# Patient Record
Sex: Female | Born: 1955 | Race: White | Hispanic: No | Marital: Single | Smoking: Never smoker
Health system: Southern US, Community
[De-identification: ages and names within clinical notes are randomized; demographics above are authoritative.]

## PROBLEM LIST (undated history)

## (undated) DIAGNOSIS — J302 Other seasonal allergic rhinitis: Secondary | ICD-10-CM

## (undated) DIAGNOSIS — I341 Nonrheumatic mitral (valve) prolapse: Secondary | ICD-10-CM

## (undated) DIAGNOSIS — R3129 Other microscopic hematuria: Secondary | ICD-10-CM

## (undated) HISTORY — DX: Other microscopic hematuria: R31.29

## (undated) HISTORY — DX: Nonrheumatic mitral (valve) prolapse: I34.1

## (undated) HISTORY — PX: OTHER SURGICAL HISTORY: SHX169

## (undated) HISTORY — PX: WISDOM TOOTH EXTRACTION: SHX21

---

## 1996-10-18 HISTORY — PX: RHINOPLASTY: SUR1284

## 1998-04-07 ENCOUNTER — Ambulatory Visit (HOSPITAL_COMMUNITY): Admission: RE | Admit: 1998-04-07 | Discharge: 1998-04-07 | Payer: Self-pay | Admitting: Obstetrics and Gynecology

## 1998-11-19 ENCOUNTER — Ambulatory Visit (HOSPITAL_COMMUNITY): Admission: RE | Admit: 1998-11-19 | Discharge: 1998-11-19 | Payer: Self-pay | Admitting: Obstetrics and Gynecology

## 1998-11-19 ENCOUNTER — Encounter: Payer: Self-pay | Admitting: Obstetrics and Gynecology

## 1999-02-25 ENCOUNTER — Other Ambulatory Visit: Admission: RE | Admit: 1999-02-25 | Discharge: 1999-02-25 | Payer: Self-pay | Admitting: Obstetrics and Gynecology

## 1999-12-03 ENCOUNTER — Encounter: Payer: Self-pay | Admitting: Obstetrics and Gynecology

## 1999-12-03 ENCOUNTER — Ambulatory Visit (HOSPITAL_COMMUNITY): Admission: RE | Admit: 1999-12-03 | Discharge: 1999-12-03 | Payer: Self-pay | Admitting: Obstetrics and Gynecology

## 2000-02-26 ENCOUNTER — Other Ambulatory Visit: Admission: RE | Admit: 2000-02-26 | Discharge: 2000-02-26 | Payer: Self-pay | Admitting: *Deleted

## 2000-12-05 ENCOUNTER — Encounter: Payer: Self-pay | Admitting: Obstetrics and Gynecology

## 2000-12-05 ENCOUNTER — Ambulatory Visit (HOSPITAL_COMMUNITY): Admission: RE | Admit: 2000-12-05 | Discharge: 2000-12-05 | Payer: Self-pay | Admitting: Obstetrics and Gynecology

## 2001-03-29 ENCOUNTER — Other Ambulatory Visit: Admission: RE | Admit: 2001-03-29 | Discharge: 2001-03-29 | Payer: Self-pay | Admitting: Obstetrics and Gynecology

## 2002-01-11 ENCOUNTER — Ambulatory Visit (HOSPITAL_COMMUNITY): Admission: RE | Admit: 2002-01-11 | Discharge: 2002-01-11 | Payer: Self-pay | Admitting: Obstetrics and Gynecology

## 2002-01-11 ENCOUNTER — Encounter: Payer: Self-pay | Admitting: Obstetrics and Gynecology

## 2002-05-07 ENCOUNTER — Other Ambulatory Visit: Admission: RE | Admit: 2002-05-07 | Discharge: 2002-05-07 | Payer: Self-pay | Admitting: Obstetrics and Gynecology

## 2002-06-08 ENCOUNTER — Encounter: Admission: RE | Admit: 2002-06-08 | Discharge: 2002-06-08 | Payer: Self-pay | Admitting: Sports Medicine

## 2002-07-13 ENCOUNTER — Encounter: Admission: RE | Admit: 2002-07-13 | Discharge: 2002-07-13 | Payer: Self-pay | Admitting: Sports Medicine

## 2002-09-04 ENCOUNTER — Encounter: Admission: RE | Admit: 2002-09-04 | Discharge: 2002-09-04 | Payer: Self-pay | Admitting: Sports Medicine

## 2003-01-15 ENCOUNTER — Encounter: Payer: Self-pay | Admitting: Obstetrics and Gynecology

## 2003-01-15 ENCOUNTER — Ambulatory Visit (HOSPITAL_COMMUNITY): Admission: RE | Admit: 2003-01-15 | Discharge: 2003-01-15 | Payer: Self-pay | Admitting: Obstetrics and Gynecology

## 2003-04-12 ENCOUNTER — Encounter: Admission: RE | Admit: 2003-04-12 | Discharge: 2003-04-12 | Payer: Self-pay | Admitting: Sports Medicine

## 2003-06-28 ENCOUNTER — Other Ambulatory Visit: Admission: RE | Admit: 2003-06-28 | Discharge: 2003-06-28 | Payer: Self-pay | Admitting: Obstetrics and Gynecology

## 2004-02-04 ENCOUNTER — Ambulatory Visit (HOSPITAL_COMMUNITY): Admission: RE | Admit: 2004-02-04 | Discharge: 2004-02-04 | Payer: Self-pay | Admitting: Obstetrics and Gynecology

## 2004-07-03 ENCOUNTER — Other Ambulatory Visit: Admission: RE | Admit: 2004-07-03 | Discharge: 2004-07-03 | Payer: Self-pay | Admitting: Obstetrics and Gynecology

## 2005-02-26 ENCOUNTER — Ambulatory Visit (HOSPITAL_COMMUNITY): Admission: RE | Admit: 2005-02-26 | Discharge: 2005-02-26 | Payer: Self-pay | Admitting: Obstetrics and Gynecology

## 2005-03-11 ENCOUNTER — Encounter: Admission: RE | Admit: 2005-03-11 | Discharge: 2005-03-11 | Payer: Self-pay | Admitting: Obstetrics and Gynecology

## 2005-08-04 ENCOUNTER — Other Ambulatory Visit: Admission: RE | Admit: 2005-08-04 | Discharge: 2005-08-04 | Payer: Self-pay | Admitting: Obstetrics and Gynecology

## 2005-08-14 ENCOUNTER — Emergency Department (HOSPITAL_COMMUNITY): Admission: EM | Admit: 2005-08-14 | Discharge: 2005-08-14 | Payer: Self-pay | Admitting: Emergency Medicine

## 2005-08-19 ENCOUNTER — Encounter: Admission: RE | Admit: 2005-08-19 | Discharge: 2005-08-19 | Payer: Self-pay | Admitting: Specialist

## 2005-09-22 ENCOUNTER — Ambulatory Visit (HOSPITAL_COMMUNITY): Admission: RE | Admit: 2005-09-22 | Discharge: 2005-09-22 | Payer: Self-pay | Admitting: Gastroenterology

## 2006-03-25 ENCOUNTER — Ambulatory Visit (HOSPITAL_COMMUNITY): Admission: RE | Admit: 2006-03-25 | Discharge: 2006-03-25 | Payer: Self-pay | Admitting: Obstetrics and Gynecology

## 2006-09-21 ENCOUNTER — Other Ambulatory Visit: Admission: RE | Admit: 2006-09-21 | Discharge: 2006-09-21 | Payer: Self-pay | Admitting: Obstetrics and Gynecology

## 2006-10-18 LAB — HM COLONOSCOPY

## 2007-04-04 ENCOUNTER — Ambulatory Visit (HOSPITAL_COMMUNITY): Admission: RE | Admit: 2007-04-04 | Discharge: 2007-04-04 | Payer: Self-pay | Admitting: Obstetrics and Gynecology

## 2007-11-13 ENCOUNTER — Other Ambulatory Visit: Admission: RE | Admit: 2007-11-13 | Discharge: 2007-11-13 | Payer: Self-pay | Admitting: Obstetrics and Gynecology

## 2008-04-09 ENCOUNTER — Ambulatory Visit (HOSPITAL_COMMUNITY): Admission: RE | Admit: 2008-04-09 | Discharge: 2008-04-09 | Payer: Self-pay | Admitting: Obstetrics and Gynecology

## 2008-04-18 ENCOUNTER — Encounter: Admission: RE | Admit: 2008-04-18 | Discharge: 2008-04-18 | Payer: Self-pay | Admitting: Obstetrics and Gynecology

## 2008-10-23 ENCOUNTER — Encounter: Admission: RE | Admit: 2008-10-23 | Discharge: 2008-10-23 | Payer: Self-pay | Admitting: Obstetrics and Gynecology

## 2008-12-05 ENCOUNTER — Other Ambulatory Visit: Admission: RE | Admit: 2008-12-05 | Discharge: 2008-12-05 | Payer: Self-pay | Admitting: Obstetrics and Gynecology

## 2009-05-12 ENCOUNTER — Encounter: Admission: RE | Admit: 2009-05-12 | Discharge: 2009-05-12 | Payer: Self-pay | Admitting: Obstetrics and Gynecology

## 2009-08-13 ENCOUNTER — Ambulatory Visit (HOSPITAL_BASED_OUTPATIENT_CLINIC_OR_DEPARTMENT_OTHER): Admission: RE | Admit: 2009-08-13 | Discharge: 2009-08-13 | Payer: Self-pay | Admitting: Orthopedic Surgery

## 2010-05-21 ENCOUNTER — Encounter: Admission: RE | Admit: 2010-05-21 | Discharge: 2010-05-21 | Payer: Self-pay | Admitting: Obstetrics and Gynecology

## 2011-03-05 NOTE — Op Note (Signed)
Allison Lang, Allison Lang             ACCOUNT NO.:  [id number]   MEDICAL RECORD NO.:  [id number]          PATIENT TYPE:  AMB   LOCATION:  ENDO                         FACILITY:  [facility]   PHYSICIAN:  Anselmo Rod, M.D.  DATE OF BIRTH:  10/12/1956   DATE OF PROCEDURE:  09/22/2005  DATE OF DISCHARGE:                                 OPERATIVE REPORT   PROCEDURE:  Screening colonoscopy.   ENDOSCOPIST:  Charna Elizabeth, M.D.   INSTRUMENT USED:  Olympus video colonoscope (adjustable pediatric scope).   INDICATIONS FOR PROCEDURE:  55 year old white female undergoing screening  colonoscopy to rule out colonic polyps, masses, etc.   PREPROCEDURE PREPARATION:  Informed consent was obtained from the patient.  The patient was fasted for four hours prior to the procedure and prepped  with Osmo prep the night of and the morning of the procedure.  The patient  received a gram of Ancef for prophylaxis prior to the procedure.  The risks  and benefits of the procedure including a 10% miss rate of cancer and polyps  were discussed with the patient, as well.   PREPROCEDURE PHYSICAL:  Patient with stable vital signs.  Neck supple.  Chest clear to auscultation.  S1 and S2 regular.  Abdomen soft with normal  bowel sounds.   DESCRIPTION OF PROCEDURE:  The patient was placed in the left lateral  decubitus position, sedated with 100 mcg Fentanyl and 10 mg Versed in slow  incremental doses.  Once the patient was adequately sedated, maintained on  low flow oxygen and continuous cardiac monitoring, the Olympus video  colonoscope was advanced from the rectum to the cecum.  The appendiceal  orifice and ileocecal valve were clearly visualized and photographed.  The  terminal ileum appeared healthy without lesions.  No  masses, polyps,  erosions, ulcerations, or diverticula were seen.  The patient tolerated the  procedure well without complications.   IMPRESSION:  1.Normal colonoscopy to the terminal ileum, no  masses or  polyps.  2.No abnormalities on noted on retroflexion.   RECOMMENDATIONS:  1.Continue high fiber diet with liberal fluid intake.  2.Repeat colonoscopy is in the next ten years unless the patient develops  any abnormal symptoms in the interim.      Anselmo Rod, M.D.  Electronically Signed     JNM/MEDQ  D:  09/22/2005  T:  09/22/2005  Job:  [id number]   cc:   Edwena Felty. Romine, M.D.  Fax: [phone]   Candyce Churn, M.D.  Fax: [phone]

## 2011-04-23 ENCOUNTER — Other Ambulatory Visit: Payer: Self-pay | Admitting: Obstetrics and Gynecology

## 2011-04-23 DIAGNOSIS — Z1231 Encounter for screening mammogram for malignant neoplasm of breast: Secondary | ICD-10-CM

## 2011-05-25 ENCOUNTER — Ambulatory Visit
Admission: RE | Admit: 2011-05-25 | Discharge: 2011-05-25 | Disposition: A | Discharge status: Home / Self Care | Payer: PRIVATE HEALTH INSURANCE | Source: Ambulatory Visit | Attending: Obstetrics and Gynecology | Admitting: Obstetrics and Gynecology

## 2011-05-25 DIAGNOSIS — Z1231 Encounter for screening mammogram for malignant neoplasm of breast: Secondary | ICD-10-CM

## 2012-03-08 ENCOUNTER — Other Ambulatory Visit: Payer: Self-pay | Admitting: Obstetrics and Gynecology

## 2012-03-08 DIAGNOSIS — Z1231 Encounter for screening mammogram for malignant neoplasm of breast: Secondary | ICD-10-CM

## 2012-03-17 ENCOUNTER — Other Ambulatory Visit: Payer: Self-pay | Admitting: Obstetrics and Gynecology

## 2012-03-17 DIAGNOSIS — N644 Mastodynia: Secondary | ICD-10-CM

## 2012-04-05 ENCOUNTER — Ambulatory Visit
Admission: RE | Admit: 2012-04-05 | Discharge: 2012-04-05 | Disposition: A | Discharge status: Home / Self Care | Payer: PRIVATE HEALTH INSURANCE | Source: Ambulatory Visit | Attending: Obstetrics and Gynecology | Admitting: Obstetrics and Gynecology

## 2012-04-05 DIAGNOSIS — N644 Mastodynia: Secondary | ICD-10-CM

## 2012-05-26 ENCOUNTER — Ambulatory Visit
Admission: RE | Admit: 2012-05-26 | Discharge: 2012-05-26 | Disposition: A | Discharge status: Home / Self Care | Payer: PRIVATE HEALTH INSURANCE | Source: Ambulatory Visit | Attending: Obstetrics and Gynecology | Admitting: Obstetrics and Gynecology

## 2012-05-26 DIAGNOSIS — Z1231 Encounter for screening mammogram for malignant neoplasm of breast: Secondary | ICD-10-CM

## 2013-01-24 ENCOUNTER — Telehealth: Payer: Self-pay | Admitting: Obstetrics and Gynecology

## 2013-01-24 NOTE — Telephone Encounter (Signed)
No, I don't think she needs another course of progesterone now.  The bloating and puffiness could be from so many different things, I think I would just wait it out and see if it resolves on its own over the next week or so.  It she's having so much bleeding with her period, it could be that we need to increase the number of times she takes the progesterone.  How is she feeling on it?  Could she tolerate taking it every other month or even every month instead of just every 3 months?

## 2013-01-24 NOTE — Telephone Encounter (Signed)
bloating post period/pt concerned/please advise/Allison Lang

## 2013-01-24 NOTE — Telephone Encounter (Signed)
Spoke with pt who had a bad period 2 weeks ago after taking progesterone. Since Saturday, pt has felt bloated and puffy, "like something is clogged up". Pt is no longer bleeding. Pt says this is new for her. Pt wondering if she needs to take more progesterone or come in for OV. Please advise.  aa

## 2013-01-24 NOTE — Telephone Encounter (Signed)
Spoke with pt about not needing progesterone again right away. Asked pt how she feels on med, and she said "Great." Pt willing to use progesterone more often. Would she still take it for 10 days if she uses it every other month or every month? Pt will give this a try and let us know if things are better with bloating.  aa

## 2013-01-24 NOTE — Telephone Encounter (Signed)
Yes, I would rec 100 mg prometrium for 10 days every month.  Can send new rx because she will need more than what she has now. OK to refill through [facility].  Thanks.

## 2013-01-25 ENCOUNTER — Other Ambulatory Visit: Payer: Self-pay | Admitting: Orthopedic Surgery

## 2013-01-25 DIAGNOSIS — N951 Menopausal and female climacteric states: Secondary | ICD-10-CM

## 2013-01-25 MED ORDER — PROGESTERONE MICRONIZED 100 MG PO CAPS: 100.0000 mg | ORAL_CAPSULE | ORAL | Status: DC

## 2013-01-25 NOTE — Telephone Encounter (Signed)
Spoke with pt about taking prometrium every month or every other month. Pt requests refill to be called to [facility].  aa    Call to [facility] to cancel refill as pt does not use this pharmacy anymore.  aa    Call to [facility] for prometrium 100 mg caps 1 PO QD 10 days of the month. Disp 30 with no refills.  aa

## 2013-01-25 NOTE — Telephone Encounter (Signed)
LMTCB for pt on VM confirming name. Instructed I will refill med at [facility].  aa

## 2013-03-22 ENCOUNTER — Encounter: Payer: Self-pay | Admitting: Obstetrics and Gynecology

## 2013-03-23 ENCOUNTER — Ambulatory Visit (INDEPENDENT_AMBULATORY_CARE_PROVIDER_SITE_OTHER): Payer: PRIVATE HEALTH INSURANCE | Admitting: Obstetrics and Gynecology

## 2013-03-23 ENCOUNTER — Encounter: Payer: Self-pay | Admitting: Obstetrics and Gynecology

## 2013-03-23 VITALS — BP 114/66 | HR 64 | Resp 16 | Ht 64.0 in | Wt 126.0 lb

## 2013-03-23 DIAGNOSIS — Z01419 Encounter for gynecological examination (general) (routine) without abnormal findings: Secondary | ICD-10-CM

## 2013-03-23 DIAGNOSIS — N950 Postmenopausal bleeding: Secondary | ICD-10-CM

## 2013-03-23 NOTE — Progress Notes (Signed)
Patient ID: Allison Lang, female   DOB: Dec 10, 1955, 57 y.o.   MRN: [id number] 57 y.o.   Single    Caucasian   female   G1P1   here for annual exam.  C/o menses very heavy and lasting 10 days or more, and since April, has had essentially daily spotting.  Takes estrace 1 mg daily and prometrium 100 mg q3 months days 1-12 since July 2012.  Had an IUD but pt intolerant of it and it was removed in July 2012.  Pt is also intolerant of progestin tx, and that is why she is on this q3 month regimen.  Today she states, though, that she will deal with the progesterone daily if necessary in exchange for no more bleeding.  Patient's last menstrual period was 01/30/2013.          Sexually active: yes  The current method of family planning is none.    Exercising: walking, golf, bicycling, strength training Last mammogram:  05/2012 Last pap smear: 03/17/12 History of abnormal pap: no  Smoking: no Alcohol: 4-5 glasses of wine per week Last colonoscopy: 09/2005, normal Last Bone Density:  08/2005 Last tetanus shot: UTD Last cholesterol check: 2014   Hgb:  PCP   Urine:  PCP   Family History  Problem Relation Age of Onset  . Cancer Father   . Liver disease Sister     will need transplant    There are no active problems to display for this patient.   Past Medical History  Diagnosis Date  . MVP (mitral valve prolapse)   . Microscopic hematuria     Past Surgical History  Procedure Laterality Date  . Rhinoplasty  1998    and septoplasty    Allergies: Codeine  Current Outpatient Prescriptions  Medication Sig Dispense Refill  . cholecalciferol (VITAMIN D) 1000 UNITS tablet Take 1,000 Units by mouth daily.      Marland Kitchen estradiol (ESTRACE) 1 MG tablet Take 1 mg by mouth daily.      . Multiple Vitamin (MULTIVITAMIN) tablet Take 1 tablet by mouth daily.      . vitamin C (ASCORBIC ACID) 500 MG tablet Take 500 mg by mouth daily.      . progesterone (PROMETRIUM) 100 MG capsule Take 1 capsule (100 mg  total) by mouth as directed.  30 capsule  0   No current facility-administered medications for this visit.    ROS: Pertinent items are noted in HPI.  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  Son Lottie Rater was adopted from [place].  He is now 58 yo and is hoping to play golf in college after hs graduation.    Exam:    BP 114/66  Pulse 64  Resp 16  Ht 5\' 4"  (1.626 m)  Wt 126 lb (57.153 kg)  BMI 21.62 kg/m2  LMP 01/30/2013  Ht and Wt stable since last year Wt Readings from Last 3 Encounters:  03/23/13 126 lb (57.153 kg)     Ht Readings from Last 3 Encounters:  03/23/13 5\' 4"  (1.626 m)    General appearance: alert, cooperative and appears stated age Head: Normocephalic, without obvious abnormality, atraumatic Neck: no adenopathy, supple, symmetrical, trachea midline and thyroid not enlarged, symmetric, no tenderness/mass/nodules Lungs: clear to auscultation bilaterally Breasts: Inspection negative, No nipple retraction or dimpling, No nipple discharge or bleeding, No axillary or supraclavicular adenopathy, Normal to palpation without dominant masses Heart: regular rate and rhythm Abdomen: soft, non-tender;  bowel sounds normal; no masses,  no organomegaly Extremities: extremities normal, atraumatic, no cyanosis or edema Skin: Skin color, texture, turgor normal. No rashes or lesions Lymph nodes: Cervical, supraclavicular, and axillary nodes normal. No abnormal inguinal nodes palpated Neurologic: Grossly normal   Pelvic: External genitalia:  no lesions              Urethra:  normal appearing urethra with no masses, tenderness or lesions              Bartholins and Skenes: normal                 Vagina: normal appearing vagina with normal color and discharge, no lesions              Cervix: normal appearance              Pap taken: no        Bimanual Exam:  Uterus:  uterus is normal size, shape, consistency and nontender                                       Adnexa: normal adnexa in size, nontender and no masses                                      Rectovaginal: Confirms                                      Anus:  normal sphincter tone, no lesions  A: normal menopausal exam, mon HRT     PMB     P: mammogram counseled on breast self exam, mammography screening, adequate intake of calcium and vitamin D, diet and exercise return annually or prn   Rec: endo biopsy and SHSG and PUS.  After informed consent, Endo bx done.    Endometrial Biopsy Procedure Note  Indications: postmenopausal bleeding   Pre-operative Diagnosis: PMB  Post-operative Diagnosis: PMB   Procedure Details   The risks (including infection, bleeding, pain, and uterine perforation) and benefits of the procedure were explained to the patient and written informed consent was obtained.    The patient was placed in the dorsal lithotomy position.  Bimanual exam was performed to assess uterine size and position.  A  speculum inserted in the vagina, and the cervix prepped with povidone iodine.  A sharp tenaculum  was  applied to the cervix.  Dilation of the cervix was not  necessary.    A pipelle was used to sample the endometrium.  The uterus sounded to  6 cm.   Sample was sent for pathologic examination.  Condition: Stable  Complications: None  Plan:  The patient was advised to call for any fever or for prolonged or severe pain or bleeding. She was advised to use OTC pain relievers as needed for mild to moderate pain. She was advised to avoid vaginal intercourse for 48 hours or until the bleeding has completely stopped.  An after visit summary was provided to the patient.          An After Visit Summary was printed and given to the patient.

## 2013-03-23 NOTE — Patient Instructions (Addendum)

## 2013-03-27 LAB — IPS CERVICAL/ECC/EMB/VULVAR/VAGINAL BIOPSY

## 2013-04-03 ENCOUNTER — Other Ambulatory Visit: Payer: Self-pay | Admitting: *Deleted

## 2013-04-03 MED ORDER — ESTRADIOL 1 MG PO TABS: 1.0000 mg | ORAL_TABLET | Freq: Every day | ORAL | Status: DC

## 2013-04-03 NOTE — Telephone Encounter (Signed)
Faxed refill request received from pharmacy for ESTRADIOL Last filled by MD on 03/17/12 X 1 YEAR Last AEX - 03/23/13 Next AEX - 03/28/14 Pt had endometrial biopsy on 03/23/13.  RX was not given at appt.  Is this OK to fill?  Please advise.

## 2013-04-03 NOTE — Telephone Encounter (Signed)
Ok to RF? 

## 2013-04-12 ENCOUNTER — Telehealth: Payer: Self-pay | Admitting: *Deleted

## 2013-04-12 DIAGNOSIS — N950 Postmenopausal bleeding: Secondary | ICD-10-CM

## 2013-04-12 NOTE — Telephone Encounter (Signed)
Message copied by Alisa Graff on Thu Apr 12, 2013  6:24 PM ------      Message from: Jerene Bears      Created: Wed Apr 04, 2013  2:31 PM       This is pt of CR who is having bleeding issues.  She is going to start taking her progesterone more frequently.  She needs an ultrasound when CR returns.  Can you schedule?  Can I place order or does that mess things up that order isn't from providing MD? ------

## 2013-04-12 NOTE — Telephone Encounter (Signed)
Message copied by Alisa Graff on Thu Apr 12, 2013  2:31 PM ------      Message from: Jerene Bears      Created: Wed Apr 04, 2013  2:31 PM       This is pt of Allison Lang who is having bleeding issues.  She is going to start taking her progesterone more frequently.  She needs an ultrasound when Allison Lang returns.  Can you schedule?  Can I place order or does that mess things up that order isn't from providing MD? ------

## 2013-04-12 NOTE — Telephone Encounter (Signed)
Patient has consult appt scheduled for 04-18-13 but no ultrasound.  I called her to schedule and she states she decided she would prefer to see dr Tresa Res first and discuss if PUS is really needed and if so, should she wait till next month after medication change.  She said that [facility] has been mentioned and she would prefer not to spend money for PUS and still have to have D&C.  She is aware that appt with Dr Tresa Res is for consult only.

## 2013-04-18 ENCOUNTER — Ambulatory Visit (INDEPENDENT_AMBULATORY_CARE_PROVIDER_SITE_OTHER): Payer: PRIVATE HEALTH INSURANCE | Admitting: Obstetrics and Gynecology

## 2013-04-18 VITALS — BP 120/74 | HR 78 | Resp 14 | Ht 64.0 in | Wt 127.0 lb

## 2013-04-18 DIAGNOSIS — N920 Excessive and frequent menstruation with regular cycle: Secondary | ICD-10-CM

## 2013-04-18 NOTE — Patient Instructions (Signed)
See you for the ultrasound.

## 2013-04-18 NOTE — Progress Notes (Signed)
57 yo SwF G0P0 (one adopted son Lottie Rater) with PMB on HRT.  Wants to discuss necessity of PUS and if she could just go on daily P4 and skip the USG.  We discussed the therapy options and the risks and benefits of each.  I rec: PUS/SHSG to r/o a focal lesion that could require a D & C to fix, even potentially to dx a cancer in a polyp.  If she proceeds straight to daily P4, that dx could be missed.  Pt states she understands, and will proceed with the PUS/SHSG.

## 2013-04-26 NOTE — Telephone Encounter (Signed)
See next phone note, appt 05-09-13.

## 2013-05-04 ENCOUNTER — Other Ambulatory Visit: Payer: Self-pay

## 2013-05-04 DIAGNOSIS — Z1231 Encounter for screening mammogram for malignant neoplasm of breast: Secondary | ICD-10-CM

## 2013-05-09 ENCOUNTER — Ambulatory Visit (INDEPENDENT_AMBULATORY_CARE_PROVIDER_SITE_OTHER): Payer: PRIVATE HEALTH INSURANCE | Admitting: Obstetrics and Gynecology

## 2013-05-09 ENCOUNTER — Ambulatory Visit (INDEPENDENT_AMBULATORY_CARE_PROVIDER_SITE_OTHER): Payer: PRIVATE HEALTH INSURANCE

## 2013-05-09 DIAGNOSIS — N950 Postmenopausal bleeding: Secondary | ICD-10-CM

## 2013-05-09 DIAGNOSIS — R9389 Abnormal findings on diagnostic imaging of other specified body structures: Secondary | ICD-10-CM

## 2013-05-09 NOTE — Progress Notes (Signed)
57 yo SWF G0P0 with PMB on estrace 1 mg qd and P4 100 mg for 12 days every three months due to intolerance of progestin (and intolerant of an IUD as well).  Here for PUS/Shsg.  PUS shows 5 fibroids, largest 2.5 cm, and in addition, and endometrial mass that appears to be a polyp, 1.3 cm, attached to the anterior wall.  Results discussed with patient.  Options for treatment would be either a D & C with resection and then see if bleeding stops despite the fibroids, or definitive tx with hysterectomy, before which she would need an endometrial biopsy.  Pros and cons of each choice discussed.  Questions answered. Pt elects D & C and will be gone to [place] to play golf Aug 15-22.  Risks and possible complications discussed and questions invitied and answered.

## 2013-05-14 ENCOUNTER — Telehealth: Payer: Self-pay | Admitting: Obstetrics and Gynecology

## 2013-05-14 NOTE — Telephone Encounter (Signed)
Patient says she is returning a call to Sally. °

## 2013-05-14 NOTE — Telephone Encounter (Signed)
Patient suggest to have scheduled D/C with Dr. Tresa Res on dates of August 7th or August 8th. Will be going out of country August 11th and will return on August 24th. Patient states if these dates are not good then will schedule after she gets back  From trip to [place].

## 2013-05-14 NOTE — Telephone Encounter (Signed)
Call to patient to notify that requested dates are not available.  Surgery done on Monday and Tuesday.  Patient ok to wait till she returns from trip.  Ok to post for surgery on Mon 06-11-13?

## 2013-05-15 NOTE — Telephone Encounter (Signed)
Surely we can squeeze in a D & C somewhere before she leaves?  See me.

## 2013-05-16 NOTE — Telephone Encounter (Signed)
I spoke to patient personally on 05-14-13 and patient prefers to wait till she returns from her trip if she can not have done on 05-24-13 or 05-25-13.  Since she is planning to travel, she is not comfortable having procedure on 05-28-13. Per Dr Tresa Res, patient needs to be aware that she will not be available for post op visit after 06-05-13 but if agreeable with that, ok to schedule then.

## 2013-05-23 NOTE — Telephone Encounter (Signed)
Patient notified of surgery date of 06-11-13 at 59 at [facility] hospital.  Patient is notified post op appointment will need to be with another MD as Dr Tresa Res will have left the practice (retirement) the week after surgery.  Should have pathology report before Dr Tresa Res leaves.  Patient is agreeable to see Dr Edward Jolly post op.  Surgical instructions reviewed and mailed. Post op scheduled. Given phone number to contact [facility] to sched preop since will be on vacation the week before surgery.

## 2013-05-25 ENCOUNTER — Encounter (HOSPITAL_COMMUNITY): Payer: Self-pay | Admitting: Obstetrics and Gynecology

## 2013-06-04 NOTE — H&P (Signed)
57 y.o. Single Caucasian female  G1P1 here for hysteroscopy, D & C, resection of polyp. C/o menses very heavy and lasting 10 days or more, and since April, has had essentially daily spotting. Takes estrace 1 mg daily and prometrium 100 mg q3 months days 1-12 since July 2012. Had an IUD but pt intolerant of it and it was removed in July 2012. Pt is also intolerant of progestin tx, and that is why she is on this q3 month regimen. Today she states, though, that she will deal with the progesterone daily if necessary in exchange for no more bleeding. On March 23, 2013 pt had an endometrial bx which showed proliferative endometrium without hyperplasia or malignancy.    On May 09, 2013, PUS showed 5 fibroids, largest 2.5 cm, and in addition, and endometrial mass that appears to be a polyp, 1.3 cm, attached to the anterior wall.     Sexually active: yes  The current method of family planning is none.  Exercising: walking, golf, bicycling, strength training  Last mammogram: 05/2012  Last pap smear: 03/17/12  History of abnormal pap: no  Smoking: no  Alcohol: 4-5 glasses of wine per week  Last colonoscopy: 09/2005, normal  Last Bone Density: 08/2005  Last tetanus shot: UTD  Last cholesterol check: 2014  Hgb: PCP Urine: PCP  Family History   Problem  Relation  Age of Onset   .  Cancer  Father    .  Liver disease  Sister      will need transplant   There are no active problems to display for this patient.  Past Medical History   Diagnosis  Date   .  MVP (mitral valve prolapse)    .  Microscopic hematuria     Past Surgical History   Procedure  Laterality  Date   .  Rhinoplasty   1998     and septoplasty   Allergies: Codeine  Current Outpatient Prescriptions   Medication  Sig  Dispense  Refill   .  cholecalciferol (VITAMIN D) 1000 UNITS tablet  Take 1,000 Units by mouth daily.     Marland Kitchen  estradiol (ESTRACE) 1 MG tablet  Take 1 mg by mouth daily.     .  Multiple Vitamin (MULTIVITAMIN) tablet   Take 1 tablet by mouth daily.     .  vitamin C (ASCORBIC ACID) 500 MG tablet  Take 500 mg by mouth daily.     .  progesterone (PROMETRIUM) 100 MG capsule  Take 1 capsule (100 mg total) by mouth as directed.  30 capsule  0    No current facility-administered medications for this visit.   ROS: Pertinent items are noted in HPI.  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 Son Lottie Rater was adopted from [place]. He is now 58 yo and is hoping to play golf in college after hs graduation.  Exam:  BP 114/66  Pulse 64  Resp 16  Ht 5\' 4"  (1.626 m)  Wt 126 lb (57.153 kg)  BMI 21.62 kg/m2  LMP 01/30/2013 Ht and Wt stable since last year  Wt Readings from Last 3 Encounters:   03/23/13  126 lb (57.153 kg)    Ht Readings from Last 3 Encounters:   03/23/13  5\' 4"  (1.626 m)   General appearance: alert, cooperative and appears stated age  Head: Normocephalic, without obvious abnormality, atraumatic  Neck: no adenopathy, supple, symmetrical, trachea midline and thyroid not enlarged, symmetric, no  tenderness/mass/nodules  Lungs: clear to auscultation bilaterally  Breasts: Inspection negative, No nipple retraction or dimpling, No nipple discharge or bleeding, No axillary or supraclavicular adenopathy, Normal to palpation without dominant masses  Heart: regular rate and rhythm  Abdomen: soft, non-tender; bowel sounds normal; no masses, no organomegaly  Extremities: extremities normal, atraumatic, no cyanosis or edema  Skin: Skin color, texture, turgor normal. No rashes or lesions  Lymph nodes: Cervical, supraclavicular, and axillary nodes normal.  No abnormal inguinal nodes palpated  Neurologic: Grossly normal  Pelvic: External genitalia: no lesions  Urethra: normal appearing urethra with no masses, tenderness or lesions  Bartholins and Skenes: normal  Vagina: normal appearing vagina with normal color and discharge, no lesions  Cervix: normal appearance  Pap taken: no   Bimanual Exam: Uterus: uterus is normal size, shape, consistency and nontender  Adnexa: normal adnexa in size, nontender and no masses  Rectovaginal: Confirms  Anus: normal sphincter tone, no lesions   A: normal menopausal exam, on HRT  PMB   P: Treatment options discussed with patient. Options for treatment would be either a D & C with resection and then see if bleeding stops despite the fibroids, or definitive tx with hysterectomy, before which she would need an endometrial biopsy. Pros and cons of each choice discussed. Questions answered. Pt elects D & C and will be gone to [place] to play golf Aug 15-22. Risks and possible complications discussed and questions invitied and answered.

## 2013-06-11 ENCOUNTER — Encounter (HOSPITAL_COMMUNITY)
Admission: RE | Disposition: A | Discharge status: Home / Self Care | Payer: Self-pay | Source: Ambulatory Visit | Attending: Obstetrics and Gynecology

## 2013-06-11 ENCOUNTER — Encounter (HOSPITAL_COMMUNITY): Payer: Self-pay | Admitting: Anesthesiology

## 2013-06-11 ENCOUNTER — Encounter (HOSPITAL_COMMUNITY): Payer: Self-pay | Admitting: *Deleted

## 2013-06-11 ENCOUNTER — Ambulatory Visit (HOSPITAL_COMMUNITY)
Admission: RE | Admit: 2013-06-11 | Discharge: 2013-06-11 | Disposition: A | Discharge status: Home / Self Care | Payer: PRIVATE HEALTH INSURANCE | Source: Ambulatory Visit | Attending: Obstetrics and Gynecology | Admitting: Obstetrics and Gynecology

## 2013-06-11 ENCOUNTER — Ambulatory Visit (HOSPITAL_COMMUNITY): Payer: PRIVATE HEALTH INSURANCE | Admitting: Anesthesiology

## 2013-06-11 DIAGNOSIS — N950 Postmenopausal bleeding: Secondary | ICD-10-CM | POA: Insufficient documentation

## 2013-06-11 DIAGNOSIS — D251 Intramural leiomyoma of uterus: Secondary | ICD-10-CM | POA: Insufficient documentation

## 2013-06-11 DIAGNOSIS — N840 Polyp of corpus uteri: Secondary | ICD-10-CM | POA: Insufficient documentation

## 2013-06-11 HISTORY — PX: DILATATION & CURRETTAGE/HYSTEROSCOPY WITH RESECTOCOPE: SHX5572

## 2013-06-11 HISTORY — DX: Other seasonal allergic rhinitis: J30.2

## 2013-06-11 LAB — CBC
HCT: 37.4 % (ref 36.0–46.0)
Hemoglobin: 12.7 g/dL (ref 12.0–15.0)
MCH: 28.8 pg (ref 26.0–34.0)
RBC: 4.41 MIL/uL (ref 3.87–5.11)

## 2013-06-11 LAB — HCG, SERUM, QUALITATIVE: Preg, Serum: NEGATIVE

## 2013-06-11 SURGERY — DILATATION & CURETTAGE/HYSTEROSCOPY WITH RESECTOCOPE
Anesthesia: General | Site: Cervix | Wound class: Contaminated

## 2013-06-11 MED ORDER — FENTANYL CITRATE 0.05 MG/ML IJ SOLN: 25.0000 ug | INTRAMUSCULAR | Status: DC | PRN

## 2013-06-11 MED ORDER — ONDANSETRON HCL 4 MG/2ML IJ SOLN
INTRAMUSCULAR | Status: AC
  Filled 2013-06-11: qty 2

## 2013-06-11 MED ORDER — MEPERIDINE HCL 25 MG/ML IJ SOLN: 6.2500 mg | INTRAMUSCULAR | Status: DC | PRN

## 2013-06-11 MED ORDER — GLYCOPYRROLATE 0.2 MG/ML IJ SOLN
INTRAMUSCULAR | Status: DC | PRN
  Administered 2013-06-11 (×2): 0.1 mg via INTRAVENOUS

## 2013-06-11 MED ORDER — MIDAZOLAM HCL 2 MG/2ML IJ SOLN
INTRAMUSCULAR | Status: AC
  Filled 2013-06-11: qty 2

## 2013-06-11 MED ORDER — FENTANYL CITRATE 0.05 MG/ML IJ SOLN
INTRAMUSCULAR | Status: AC
  Filled 2013-06-11: qty 5

## 2013-06-11 MED ORDER — PROPOFOL 10 MG/ML IV BOLUS
INTRAVENOUS | Status: DC | PRN
  Administered 2013-06-11: 120 mg via INTRAVENOUS

## 2013-06-11 MED ORDER — FENTANYL CITRATE 0.05 MG/ML IJ SOLN
INTRAMUSCULAR | Status: AC
  Filled 2013-06-11: qty 2

## 2013-06-11 MED ORDER — METOCLOPRAMIDE HCL 5 MG/ML IJ SOLN: 10.0000 mg | Freq: Once | INTRAMUSCULAR | Status: DC | PRN

## 2013-06-11 MED ORDER — DEXAMETHASONE SODIUM PHOSPHATE 10 MG/ML IJ SOLN
INTRAMUSCULAR | Status: AC
  Filled 2013-06-11: qty 1

## 2013-06-11 MED ORDER — DEXAMETHASONE SODIUM PHOSPHATE 4 MG/ML IJ SOLN
INTRAMUSCULAR | Status: DC | PRN
  Administered 2013-06-11: 8 mg via INTRAVENOUS

## 2013-06-11 MED ORDER — LIDOCAINE HCL (CARDIAC) 20 MG/ML IV SOLN
INTRAVENOUS | Status: DC | PRN
  Administered 2013-06-11: 50 mg via INTRAVENOUS

## 2013-06-11 MED ORDER — KETOROLAC TROMETHAMINE 30 MG/ML IJ SOLN
INTRAMUSCULAR | Status: DC | PRN
  Administered 2013-06-11: 30 mg via INTRAMUSCULAR

## 2013-06-11 MED ORDER — LIDOCAINE HCL (CARDIAC) 20 MG/ML IV SOLN
INTRAVENOUS | Status: AC
  Filled 2013-06-11: qty 5

## 2013-06-11 MED ORDER — MIDAZOLAM HCL 5 MG/5ML IJ SOLN
INTRAMUSCULAR | Status: DC | PRN
  Administered 2013-06-11: 2 mg via INTRAVENOUS

## 2013-06-11 MED ORDER — ONDANSETRON HCL 4 MG/2ML IJ SOLN
INTRAMUSCULAR | Status: DC | PRN
  Administered 2013-06-11: 4 mg via INTRAVENOUS

## 2013-06-11 MED ORDER — LIDOCAINE HCL 1 % IJ SOLN
INTRAMUSCULAR | Status: DC | PRN
  Administered 2013-06-11: 20 mL

## 2013-06-11 MED ORDER — LACTATED RINGERS IV SOLN
INTRAVENOUS | Status: DC
  Administered 2013-06-11: 125 mL/h via INTRAVENOUS
  Administered 2013-06-11: 12:00:00 via INTRAVENOUS

## 2013-06-11 MED ORDER — PROPOFOL 10 MG/ML IV EMUL
INTRAVENOUS | Status: AC
  Filled 2013-06-11: qty 20

## 2013-06-11 MED ORDER — FENTANYL CITRATE 0.05 MG/ML IJ SOLN
INTRAMUSCULAR | Status: DC | PRN
  Administered 2013-06-11 (×3): 50 ug via INTRAVENOUS

## 2013-06-11 SURGICAL SUPPLY — 21 items
CANISTER SUCTION 2500CC (MISCELLANEOUS) ×2 IMPLANT
CATH ROBINSON RED A/P 16FR (CATHETERS) ×2 IMPLANT
CONTAINER PREFILL 10% NBF 60ML (FORM) ×4 IMPLANT
CORD ACTIVE DISPOSABLE (ELECTRODE) ×1
CORD ELECTRO ACTIVE DISP (ELECTRODE) ×1 IMPLANT
DECANTER SPIKE VIAL GLASS SM (MISCELLANEOUS) ×1 IMPLANT
DRESSING TELFA 8X3 (GAUZE/BANDAGES/DRESSINGS) ×2 IMPLANT
ELECT LOOP GYNE PRO 24FR (CUTTING LOOP) ×2
ELECT REM PT RETURN 9FT ADLT (ELECTROSURGICAL) ×2
ELECT VAPORTRODE GRVD BAR (ELECTRODE) IMPLANT
ELECTRODE LOOP GYNE PRO 24FR (CUTTING LOOP) IMPLANT
ELECTRODE REM PT RTRN 9FT ADLT (ELECTROSURGICAL) ×1 IMPLANT
GLOVE BIOGEL PI IND STRL 7.0 (GLOVE) ×1 IMPLANT
GLOVE BIOGEL PI INDICATOR 7.0 (GLOVE) ×2
GLOVE ECLIPSE 6.5 STRL STRAW (GLOVE) ×2 IMPLANT
GLOVE SURG SS PI 7.0 STRL IVOR (GLOVE) ×1 IMPLANT
GOWN STRL REIN XL XLG (GOWN DISPOSABLE) ×4 IMPLANT
PACK HYSTEROSCOPY LF (CUSTOM PROCEDURE TRAY) ×2 IMPLANT
PAD OB MATERNITY 4.3X12.25 (PERSONAL CARE ITEMS) ×2 IMPLANT
TOWEL OR 17X24 6PK STRL BLUE (TOWEL DISPOSABLE) ×4 IMPLANT
WATER STERILE IRR 1000ML POUR (IV SOLUTION) ×2 IMPLANT

## 2013-06-11 NOTE — Interval H&P Note (Signed)
History and Physical Interval Note:  06/11/2013 11:31 AM  Allison Lang  has presented today for surgery, with the diagnosis of Post Meonpausal Bleeding  The various methods of treatment have been discussed with the patient and family. After consideration of risks, benefits and other options for treatment, the patient has consented to  Procedure(s): DILATATION & CURETTAGE/HYSTEROSCOPY WITH RESECTOCOPE (N/A) as a surgical intervention .  The patient's history has been reviewed, patient examined, no change in status, stable for surgery.  I have reviewed the patient's chart and labs.  Questions were answered to the patient's satisfaction.     [name]

## 2013-06-11 NOTE — Transfer of Care (Signed)
Immediate Anesthesia Transfer of Care Note  Patient: Allison Lang  Procedure(s) Performed: Procedure(s): DILATATION & CURETTAGE/HYSTEROSCOPY WITH RESECTOCOPE (N/A)  Patient Location: PACU  Anesthesia Type:General  Level of Consciousness: awake, alert , oriented and patient cooperative  Airway & Oxygen Therapy: Patient Spontanous Breathing and Patient connected to nasal cannula oxygen  Post-op Assessment: Report given to PACU RN and Post -op Vital signs reviewed and stable  Post vital signs: stable  Complications: No apparent anesthesia complications

## 2013-06-11 NOTE — Anesthesia Preprocedure Evaluation (Signed)
Anesthesia Evaluation  Patient identified by MRN, date of birth, ID band Patient awake    Reviewed: Allergy & Precautions, H&P , NPO status , Patient's Chart, lab work & pertinent test results  Airway Mallampati: II TM Distance: >3 FB Neck ROM: Full    Dental no notable dental hx. (+) Teeth Intact   Pulmonary neg pulmonary ROS,  breath sounds clear to auscultation  Pulmonary exam normal       Cardiovascular negative cardio ROS  + Valvular Problems/Murmurs MVP Rhythm:Regular Rate:Normal     Neuro/Psych negative psych ROS   GI/Hepatic negative GI ROS, Neg liver ROS,   Endo/Other  negative endocrine ROS  Renal/GU negative Renal ROS  negative genitourinary   Musculoskeletal negative musculoskeletal ROS (+)   Abdominal Normal abdominal exam  (+)   Peds  Hematology negative hematology ROS (+)   Anesthesia Other Findings   Reproductive/Obstetrics PMB Endometrial Polyp                           Anesthesia Physical Anesthesia Plan  ASA: I  Anesthesia Plan: General   Post-op Pain Management:    Induction: Intravenous  Airway Management Planned: LMA  Additional Equipment:   Intra-op Plan:   Post-operative Plan:   Informed Consent: I have reviewed the patients History and Physical, chart, labs and discussed the procedure including the risks, benefits and alternatives for the proposed anesthesia with the patient or authorized representative who has indicated his/her understanding and acceptance.   Dental advisory given  Plan Discussed with: Anesthesiologist, CRNA and Surgeon  Anesthesia Plan Comments:         Anesthesia Quick Evaluation

## 2013-06-11 NOTE — Anesthesia Postprocedure Evaluation (Signed)
  Anesthesia Post-op Note  Patient: Allison Lang  Procedure(s) Performed: Procedure(s): DILATATION & CURETTAGE/HYSTEROSCOPY WITH RESECTOCOPE (N/A)  Patient Location: PACU  Anesthesia Type:General  Level of Consciousness: awake, alert  and oriented  Airway and Oxygen Therapy: Patient Spontanous Breathing  Post-op Pain: none  Post-op Assessment: Post-op Vital signs reviewed, Patient's Cardiovascular Status Stable, Respiratory Function Stable, Patent Airway, No signs of Nausea or vomiting and Pain level controlled  Post-op Vital Signs: Reviewed and stable  Complications: No apparent anesthesia complications

## 2013-06-11 NOTE — Op Note (Signed)
Preoperative diagnosis: Postmenopausal bleeding, intramural fibroids and endometrial polyps seen on sonohysterogram Postoperative diagnosis: Same, path pending Procedure: Hysteroscopic resection of polyp, D&C Surgeon: Dr. Aram Beecham Romine Anesthesia: Gen. Estimated blood loss: Minimal Complications: None Glycine deficit: 0 Procedure: The patient was taken to the operating room and after induction of adequate general anesthesia was placed in the dorsolithotomy position and prepped and draped in usual fashion.  The bladder was drained with a red rubber catheter.  A posterior weighted and anterior sims retractor were placed.  The cervix was grasped on its anterior lip with a single-tooth tenaculum.  Paracervical block was instituted by injecting 10 cc of 1% lidocaine at each of 3 and 9:00  The uterus sounded to 7.5 cm.  The cervix was dilated to #31 Shawnie Pons.  The operative hysteroscope was introduced using glycine as a distention medium with the pressure pump set on 90 mm of mercury.  The endocervical canal was cleaned.  The endometrial canal had a solitary polyp emanating from the anterior wall of the fundus.  Single loop cautery was used to resect the polyp.  The instruments were then withdrawn.  Gentle sharp curettage was done.  The hysteroscope was then reintroduced and the cavity appeared clean.  Photographic documentation was taken.  The instruments were withdrawn.  Patient was taken to recovery room in satisfactory condition.

## 2013-06-12 ENCOUNTER — Encounter (HOSPITAL_COMMUNITY): Payer: Self-pay | Admitting: Obstetrics and Gynecology

## 2013-06-12 ENCOUNTER — Encounter: Payer: Self-pay | Admitting: Obstetrics and Gynecology

## 2013-06-13 ENCOUNTER — Ambulatory Visit
Admission: RE | Admit: 2013-06-13 | Discharge: 2013-06-13 | Disposition: A | Discharge status: Home / Self Care | Payer: PRIVATE HEALTH INSURANCE | Source: Ambulatory Visit

## 2013-06-13 DIAGNOSIS — Z1231 Encounter for screening mammogram for malignant neoplasm of breast: Secondary | ICD-10-CM

## 2013-07-05 ENCOUNTER — Encounter: Payer: Self-pay | Admitting: Obstetrics and Gynecology

## 2013-07-05 ENCOUNTER — Ambulatory Visit (INDEPENDENT_AMBULATORY_CARE_PROVIDER_SITE_OTHER): Payer: PRIVATE HEALTH INSURANCE | Admitting: Obstetrics and Gynecology

## 2013-07-05 VITALS — BP 100/64 | HR 70 | Ht 64.0 in | Wt 125.5 lb

## 2013-07-05 DIAGNOSIS — N951 Menopausal and female climacteric states: Secondary | ICD-10-CM

## 2013-07-05 MED ORDER — PROGESTERONE MICRONIZED 100 MG PO CAPS: 100.0000 mg | ORAL_CAPSULE | Freq: Every day | ORAL | Status: DC

## 2013-07-05 MED ORDER — PROGESTERONE MICRONIZED 100 MG PO CAPS: 100.0000 mg | ORAL_CAPSULE | ORAL | Status: DC

## 2013-07-05 NOTE — Patient Instructions (Signed)
Please take your Prometirum daily.

## 2013-07-05 NOTE — Progress Notes (Signed)
Patient ID: Allison Lang, female   DOB: September 27, 1956, 57 y.o.   MRN: [id number] GYNECOLOGY PROBLEM VISIT  PCP: R. Johnella Moloney, MD  Referring provider:   HPI: 57 y.o.   Single  Caucasian  female   G0P0 with Patient's last menstrual period was 01/30/2013.   here for  3 weeks post operative.  Status post hysteroscopic resection of polyp and dilation and curettage for benign endometrial polyp on 06/11/13. Preop diagnoses were postmenopausal bleeding (heavy withdrawal bleeding), endometrial polyp, and fibroids. Patient is on Estrace 1.0 mg daily and Prometrium for 12 days every 3 months.  Prometrium was making the patient very tired.  Wants to take Prometrium nightly instead of every 90 days to the heavy withdrawal bleeding.   Notes clear discharge. No problems post surgery.  GYNECOLOGIC HISTORY: Patient's last menstrual period was 01/30/2013. Sexually active:  yes Partner preference: female Contraception:  none  Menopausal hormone therapy: Estrace/Progesterone DES exposure: no   Blood transfusions: no   Sexually transmitted diseases:  no  GYN Procedures:  Dilatation and curettage/hysteroscopy Mammogram:  05-04-2013 [facility]              Pap: 03-17-12 wnl with Neg HR HPV   History of abnormal pap smear:     OB History   Grav Para Term Preterm Abortions TAB SAB Ect Mult Living   0 0        1         Family History  Problem Relation Age of Onset  . Cancer Father   . Liver disease Sister     will need transplant    There are no active problems to display for this patient.   Past Medical History  Diagnosis Date  . MVP (mitral valve prolapse)   . Microscopic hematuria   . MVP (mitral valve prolapse)     no pretreatment needed - never had any problems  . Seasonal allergies     Past Surgical History  Procedure Laterality Date  . Rhinoplasty  1998    and septoplasty  . Left foot surgery      with screws  . Wisdom tooth extraction    . Dilatation &  currettage/hysteroscopy with resectocope N/A 06/11/2013    Procedure: DILATATION & CURETTAGE/HYSTEROSCOPY WITH RESECTOCOPE;  Surgeon: Alison Murray, MD;  Location: [facility];  Service: Gynecology;  Laterality: N/A;    ALLERGIES: Codeine  Current Outpatient Prescriptions  Medication Sig Dispense Refill  . cholecalciferol (VITAMIN D) 1000 UNITS tablet Take 1,000 Units by mouth daily.      Marland Kitchen estradiol (ESTRACE) 1 MG tablet Take 1 tablet (1 mg total) by mouth daily.  90 tablet  3  . Multiple Vitamin (MULTIVITAMIN) tablet Take 1 tablet by mouth daily.      . progesterone (PROMETRIUM) 100 MG capsule Take 1 capsule (100 mg total) by mouth as directed.  30 capsule  0  . vitamin C (ASCORBIC ACID) 500 MG tablet Take 500 mg by mouth daily.       No current facility-administered medications for this visit.     ROS:  Pertinent items are noted in HPI.  SOCIAL HISTORY:  Clinical research associate - bankruptcy.  PHYSICAL EXAMINATION:    BP 100/64  Pulse 70  Ht 5\' 4"  (1.626 m)  Wt 125 lb 8 oz (56.926 kg)  BMI 21.53 kg/m2  LMP 01/30/2013   Wt Readings from Last 3 Encounters:  07/05/13 125 lb 8 oz (56.926 kg)  05/25/13 127 lb (57.607  kg)  05/25/13 127 lb (57.607 kg)     Ht Readings from Last 3 Encounters:  07/05/13 5\' 4"  (1.626 m)  05/25/13 5\' 4"  (1.626 m)  05/25/13 5\' 4"  (1.626 m)    General appearance: alert, cooperative and appears stated age  Pelvic: External genitalia:  no lesions              Urethra:  normal appearing urethra with no masses, tenderness or lesions              Bartholins and Skenes: normal                 Vagina: normal appearing vagina with normal color and discharge, no lesions              Cervix: normal appearance                      Bimanual Exam:  Uterus:  uterus is normal size, shape, consistency and nontender                                      Adnexa: normal adnexa in size, nontender and no masses                                       ASSESSMENT  Status post  hysteroscopic polypectomy. Uterine fibroids.  PLAN  Will continue on daily Estrace and will being to take Prometrium 100 mg. Follow up for annual exam and prn.  An After Visit Summary was printed and given to the patient.

## 2013-08-02 ENCOUNTER — Ambulatory Visit: Payer: PRIVATE HEALTH INSURANCE | Admitting: Nurse Practitioner

## 2013-08-02 ENCOUNTER — Telehealth: Payer: Self-pay | Admitting: Nurse Practitioner

## 2013-08-02 NOTE — Telephone Encounter (Signed)
Pt came in 22 minutes late for her appointment. Told pt she may have a wait to be worked back into the schedule. Pt stated she did not want to wait and would handle it herself. Pt declined appointment for tomorrow also.

## 2013-08-02 NOTE — Telephone Encounter (Signed)
LMTCB

## 2013-08-06 ENCOUNTER — Encounter: Payer: Self-pay | Admitting: Nurse Practitioner

## 2013-08-06 ENCOUNTER — Ambulatory Visit (INDEPENDENT_AMBULATORY_CARE_PROVIDER_SITE_OTHER): Payer: PRIVATE HEALTH INSURANCE | Admitting: Nurse Practitioner

## 2013-08-06 VITALS — BP 110/64 | HR 64 | Temp 97.8°F | Ht 64.0 in | Wt 127.0 lb

## 2013-08-06 DIAGNOSIS — N390 Urinary tract infection, site not specified: Secondary | ICD-10-CM

## 2013-08-06 MED ORDER — CIPROFLOXACIN HCL 500 MG PO TABS: 500.0000 mg | ORAL_TABLET | Freq: Two times a day (BID) | ORAL | Status: DC

## 2013-08-06 NOTE — Progress Notes (Signed)
Subjective:     Patient ID: Allison Lang, female   DOB: 02-01-1956, 57 y.o.   MRN: [id number]  HPI   57 yo WM Fe presents with urinary symptoms since last Thursday am with symptoms urgency, frequency, dysuria. She had sexually activity on Tuesday and usually this not initiate UTI symptoms.  No fever or chills.  Some low back pain that was "achy". No N/V or other symptoms. No vaginal discharge.  She is on HRT and last withdrawal bleed was last week.   Last UTI was 04/2011.  patient had 3 doses of PCN on hand and took them over the weekend since she was symptomatic.  Last dose on Saturday.  She has continued with Pyridium.   Review of Systems  Constitutional: Negative for fever, chills and fatigue.  Respiratory: Negative.   Cardiovascular: Negative.   Gastrointestinal: Positive for abdominal pain and abdominal distention. Negative for nausea, vomiting, diarrhea, constipation and blood in stool.  Genitourinary: Positive for dysuria, frequency and difficulty urinating. Negative for hematuria, decreased urine volume, vaginal discharge, vaginal pain and dyspareunia.       LMP on HRT was 10/13 X 3 days.  Musculoskeletal: Positive for back pain. Negative for myalgias.  Neurological: Negative.   Psychiatric/Behavioral: Negative.        Objective:   Physical Exam  Constitutional: She is oriented to person, place, and time. She appears well-developed and well-nourished.  Abdominal: Soft. She exhibits no distension. There is no tenderness. There is no rebound and no guarding.  No flank pain.  Neurological: She is alert and oriented to person, place, and time.  Psychiatric: She has a normal mood and affect. Her behavior is normal. Judgment and thought content normal.       Assessment:     R/O UTI    Plan:     Cipro 500 mg BID # 14/0 refill Will follow with urine Culture and Micro since Pyridium has made our chemstrip not show anything. If symptoms worsens to call back.

## 2013-08-06 NOTE — Patient Instructions (Signed)
Urinary Tract Infection  Urinary tract infections (UTIs) can develop anywhere along your urinary tract. Your urinary tract is your body's drainage system for removing wastes and extra water. Your urinary tract includes two kidneys, two ureters, a bladder, and a urethra. Your kidneys are a pair of bean-shaped organs. Each kidney is about the size of your fist. They are located below your ribs, one on each side of your spine.  CAUSES  Infections are caused by microbes, which are microscopic organisms, including fungi, viruses, and bacteria. These organisms are so small that they can only be seen through a microscope. Bacteria are the microbes that most commonly cause UTIs.  SYMPTOMS   Symptoms of UTIs may vary by age and gender of the patient and by the location of the infection. Symptoms in young women typically include a frequent and intense urge to urinate and a painful, burning feeling in the bladder or urethra during urination. Older women and men are more likely to be tired, shaky, and weak and have muscle aches and abdominal pain. A fever may mean the infection is in your kidneys. Other symptoms of a kidney infection include pain in your back or sides below the ribs, nausea, and vomiting.  DIAGNOSIS  To diagnose a UTI, your caregiver will ask you about your symptoms. Your caregiver also will ask to provide a urine sample. The urine sample will be tested for bacteria and white blood cells. White blood cells are made by your body to help fight infection.  TREATMENT   Typically, UTIs can be treated with medication. Because most UTIs are caused by a bacterial infection, they usually can be treated with the use of antibiotics. The choice of antibiotic and length of treatment depend on your symptoms and the type of bacteria causing your infection.  HOME CARE INSTRUCTIONS   If you were prescribed antibiotics, take them exactly as your caregiver instructs you. Finish the medication even if you feel better after you  have only taken some of the medication.   Drink enough water and fluids to keep your urine clear or pale yellow.   Avoid caffeine, tea, and carbonated beverages. They tend to irritate your bladder.   Empty your bladder often. Avoid holding urine for long periods of time.   Empty your bladder before and after sexual intercourse.   After a bowel movement, women should cleanse from front to back. Use each tissue only once.  SEEK MEDICAL CARE IF:    You have back pain.   You develop a fever.   Your symptoms do not begin to resolve within 3 days.  SEEK IMMEDIATE MEDICAL CARE IF:    You have severe back pain or lower abdominal pain.   You develop chills.   You have nausea or vomiting.   You have continued burning or discomfort with urination.  MAKE SURE YOU:    Understand these instructions.   Will watch your condition.   Will get help right away if you are not doing well or get worse.  Document Released: 07/14/2005 Document Revised: 04/04/2012 Document Reviewed: 11/12/2011  Exit[employer] Patient Information 2014 [employer].

## 2013-08-07 LAB — URINALYSIS, MICROSCOPIC ONLY
Bacteria, UA: NONE SEEN
Crystals: NONE SEEN

## 2013-08-07 NOTE — Progress Notes (Signed)
Encounter reviewed by Dr. Brook Silva.  

## 2013-08-09 ENCOUNTER — Telehealth: Payer: Self-pay | Admitting: *Deleted

## 2013-08-09 NOTE — Telephone Encounter (Signed)
Message copied by Luisa Dago on Thu Aug 09, 2013 10:53 AM ------      Message from: Ria Comment R      Created: Thu Aug 09, 2013  8:33 AM       Let patient know that urine culture is negative and can discontinue Cipro.  She most likely had cystitis and usually 3 day of antibiotic will help that.  If continued symptoms then complete antibiotic. ------

## 2013-08-09 NOTE — Telephone Encounter (Signed)
Pt scheduled and kept appt for 08/06/13.  Encounter closed.

## 2013-08-09 NOTE — Telephone Encounter (Signed)
I have attempted to contact this patient by phone with the following results: left message to return my call on answering machine (mobile).  

## 2013-08-10 NOTE — Telephone Encounter (Signed)
Notified of results

## 2013-08-22 ENCOUNTER — Ambulatory Visit: Payer: PRIVATE HEALTH INSURANCE

## 2013-08-23 ENCOUNTER — Other Ambulatory Visit: Payer: Self-pay

## 2014-03-26 ENCOUNTER — Telehealth: Payer: Self-pay | Admitting: Nurse Practitioner

## 2014-03-26 NOTE — Telephone Encounter (Signed)
Patient called during lunch saying she wanted to cancel her appt for 03/28/14 at 7:00am. Gave call back # [phone] tried # back but is disconnected. Canceled appt lmtcb

## 2014-03-28 ENCOUNTER — Ambulatory Visit: Payer: PRIVATE HEALTH INSURANCE | Admitting: Obstetrics and Gynecology

## 2014-04-29 ENCOUNTER — Other Ambulatory Visit: Payer: Self-pay | Admitting: Obstetrics & Gynecology

## 2014-04-29 NOTE — Telephone Encounter (Signed)
Last filled: 04/03/13 #90/3 refills Last Aex: 03/23/13 with Dr. Joan Flores  Last Mammogram: 06/13/13 Bi-Rads 1 Aex scheduled for 05/22/14 with Dr.Silva Estradiol 1 mg #90/0 refills sent to pharmacy to last her until AEX

## 2014-05-20 ENCOUNTER — Other Ambulatory Visit: Payer: Self-pay

## 2014-05-20 DIAGNOSIS — Z1231 Encounter for screening mammogram for malignant neoplasm of breast: Secondary | ICD-10-CM

## 2014-05-22 ENCOUNTER — Ambulatory Visit: Payer: PRIVATE HEALTH INSURANCE | Admitting: Obstetrics and Gynecology

## 2014-06-10 ENCOUNTER — Ambulatory Visit: Payer: PRIVATE HEALTH INSURANCE | Admitting: Obstetrics and Gynecology

## 2014-06-14 ENCOUNTER — Ambulatory Visit (INDEPENDENT_AMBULATORY_CARE_PROVIDER_SITE_OTHER): Payer: PRIVATE HEALTH INSURANCE | Admitting: Obstetrics and Gynecology

## 2014-06-14 ENCOUNTER — Encounter: Payer: Self-pay | Admitting: Obstetrics and Gynecology

## 2014-06-14 VITALS — BP 120/74 | HR 68 | Resp 16 | Wt 127.6 lb

## 2014-06-14 DIAGNOSIS — Z01419 Encounter for gynecological examination (general) (routine) without abnormal findings: Secondary | ICD-10-CM

## 2014-06-14 DIAGNOSIS — Z0000 Encounter for general adult medical examination without abnormal findings: Secondary | ICD-10-CM

## 2014-06-14 DIAGNOSIS — R3129 Other microscopic hematuria: Secondary | ICD-10-CM

## 2014-06-14 DIAGNOSIS — Z23 Encounter for immunization: Secondary | ICD-10-CM

## 2014-06-14 LAB — POCT URINALYSIS DIPSTICK
BILIRUBIN UA: NEGATIVE
Glucose, UA: NEGATIVE
KETONES UA: NEGATIVE
LEUKOCYTES UA: NEGATIVE
Nitrite, UA: NEGATIVE
PH UA: 5
Protein, UA: NEGATIVE
Urobilinogen, UA: NEGATIVE

## 2014-06-14 LAB — URINALYSIS, MICROSCOPIC ONLY
Bacteria, UA: NONE SEEN
CASTS: NONE SEEN
Crystals: NONE SEEN

## 2014-06-14 NOTE — Patient Instructions (Signed)

## 2014-06-14 NOTE — Progress Notes (Signed)
Patient ID: Allison Lang, female   DOB: [birth year], 58 y.o.   MRN: [id number] GYNECOLOGY VISIT  PCP:  Marcellus Scott, MD  Referring provider:   HPI: 58 y.o.   Single  Caucasian  female   G0P0 with Patient's last menstrual period was 01/30/2013.   here for   AEX. Had stopped menses for 4 years prior to starting HRT.  Did a regimen with Prometrium every 3 months.  Was having periods every 3 months and now taking daily starting in July. Was having "severe" periods.  No bleeding as long as was not on the withdrawal. Desires to come off HRT.   Hgb:   Urine:  Moderate RBC's - long standing.  Never saw urology.   GYNECOLOGIC HISTORY: Patient's last menstrual period was 01/30/2013. Sexually active:  yes Partner preference: female Contraception:   postmenopausal Menopausal hormone therapy: Estradiol and Progesterone DES exposure:   no Blood transfusions: no   Sexually transmitted diseases:   no GYN procedures and prior surgeries:  Hysteroscopy/D & C/Benign polypectomy 05/2013 Last mammogram:  06-13-13 heterogeneously dense breasts, otherwise normal:[facility].              Last pap and high risk HPV testing:  03-17-12 wnl:neg HR HPV  History of abnormal pap smear: no    OB History   Grav Para Term Preterm Abortions TAB SAB Ect Mult Living   0 0        1       LIFESTYLE: Exercise: walking/golf/gym             OTHER HEALTH MAINTENANCE: Tetanus/TDap:  2004 HPV:                  n/a Influenza:          07/2013   Bone density:     08/2005 normal:[facility] Colonoscopy:     09/2005 normal with Dr. Collene Mares.  Next colonoscopy due 09/2015.  Cholesterol check: 2015 ratios are good.  Family History  Problem Relation Age of Onset  . Cancer Father   . Liver disease Sister     will need transplant    There are no active problems to display for this patient.  Past Medical History  Diagnosis Date  . MVP (mitral valve prolapse)   . Microscopic hematuria   . MVP  (mitral valve prolapse)     no pretreatment needed - never had any problems  . Seasonal allergies     Past Surgical History  Procedure Laterality Date  . Rhinoplasty  1998    and septoplasty  . Left foot surgery      with screws  . Wisdom tooth extraction    . Dilatation & currettage/hysteroscopy with resectocope N/A 06/11/2013    Procedure: DILATATION & CURETTAGE/HYSTEROSCOPY WITH RESECTOCOPE;  Surgeon: Peri Maris, MD;  Location: [facility];  Service: Gynecology;  Laterality: N/A;    ALLERGIES: Codeine  Current Outpatient Prescriptions  Medication Sig Dispense Refill  . cholecalciferol (VITAMIN D) 1000 UNITS tablet Take 1,000 Units by mouth daily.      Marland Kitchen estradiol (ESTRACE) 1 MG tablet TAKE ONE TABLET BY MOUTH ONE TIME DAILY   90 tablet  2  . Multiple Vitamin (MULTIVITAMIN) tablet Take 1 tablet by mouth daily.      . progesterone (PROMETRIUM) 100 MG capsule Take 1 capsule (100 mg total) by mouth daily.  90 capsule  2  . vitamin C (ASCORBIC ACID) 500 MG tablet Take 500 mg by  mouth daily.       No current facility-administered medications for this visit.     ROS:  Pertinent items are noted in HPI.  History   Social History  . Marital Status: Single    Spouse Name: N/A    Number of Children: N/A  . Years of Education: N/A   Occupational History  . Lawyer    Social History Main Topics  . Smoking status: Never Smoker   . Smokeless tobacco: Never Used  . Alcohol Use: 2.0 - 2.5 oz/week    4-5 drink(s) per week     Comment: socially  . Drug Use: No  . Sexual Activity: Yes    Partners: Male    Birth Control/ Protection: Post-menopausal   Other Topics Concern  . Not on file   Social History Narrative  . No narrative on file    PHYSICAL EXAMINATION:    BP 120/74  Pulse 68  Resp 16  Wt 127 lb 9.6 oz (57.879 kg)  LMP 01/30/2013   Wt Readings from Last 3 Encounters:  06/14/14 127 lb 9.6 oz (57.879 kg)  08/06/13 127 lb (57.607 kg)  07/05/13 125 lb 8 oz (56.926  kg)     Ht Readings from Last 3 Encounters:  08/06/13 5\' 4"  (1.626 m)  07/05/13 5\' 4"  (1.626 m)  05/25/13 5\' 4"  (1.626 m)    General appearance: alert, cooperative and appears stated age Head: Normocephalic, without obvious abnormality, atraumatic Neck: no adenopathy, supple, symmetrical, trachea midline and thyroid not enlarged, symmetric, no tenderness/mass/nodules Lungs: clear to auscultation bilaterally Breasts: Inspection negative, No nipple retraction or dimpling, No nipple discharge or bleeding, No axillary or supraclavicular adenopathy, Normal to palpation without dominant masses Heart: regular rate and rhythm Abdomen: soft, non-tender; no masses,  no organomegaly Extremities: extremities normal, atraumatic, no cyanosis or edema Skin: Skin color, texture, turgor normal. No rashes or lesions Lymph nodes: Cervical, supraclavicular, and axillary nodes normal. No abnormal inguinal nodes palpated Neurologic: Grossly normal  Pelvic: External genitalia:  no lesions              Urethra:  normal appearing urethra with no masses, tenderness or lesions              Bartholins and Skenes: normal                 Vagina: normal appearing vagina with normal color and discharge, no lesions              Cervix: normal appearance              Pap and high risk HPV testing done: No.        Bimanual Exam:  Uterus:  uterus is normal size, shape, consistency and nontender                                      Adnexa: normal adnexa in size, nontender and no masses                                      Rectovaginal:  Yes.                                        Confirms above.  Anus:  normal sphincter tone, no lesions  ASSESSMENT  Normal gynecologic exam. Heavy cycles on periodic progesterone therapy for HRT.   No bleeding on continuous HRT.  Long standing microscopic hematuria.   PLAN  Mammogram recommended yearly starting at age 39. Pap smear and high  risk HPV testing as above. Stop HRT. Try Hoy Register.  Counseled on self breast exam, Calcium and vitamin D intake, exercise. See lab orders: Yes.  Urine micro and urine culture.  Referral to Urology.  TDap.  Return annually or prn   An After Visit Summary was printed and given to the patient.

## 2014-06-15 LAB — URINE CULTURE
Colony Count: NO GROWTH
Organism ID, Bacteria: NO GROWTH

## 2014-06-18 ENCOUNTER — Ambulatory Visit
Admission: RE | Admit: 2014-06-18 | Discharge: 2014-06-18 | Disposition: A | Discharge status: Home / Self Care | Payer: PRIVATE HEALTH INSURANCE | Source: Ambulatory Visit

## 2014-06-18 DIAGNOSIS — Z1231 Encounter for screening mammogram for malignant neoplasm of breast: Secondary | ICD-10-CM

## 2014-08-02 ENCOUNTER — Other Ambulatory Visit: Payer: Self-pay

## 2014-08-15 ENCOUNTER — Telehealth: Payer: Self-pay | Admitting: Obstetrics and Gynecology

## 2014-08-15 DIAGNOSIS — N951 Menopausal and female climacteric states: Secondary | ICD-10-CM

## 2014-08-15 NOTE — Telephone Encounter (Signed)
Patient calling stating she has "started taking estrogen and progesterone" again. She requests RX's be called into: [facility] - [place], [place] - [address].

## 2014-08-16 MED ORDER — PROGESTERONE MICRONIZED 100 MG PO CAPS: 100.0000 mg | ORAL_CAPSULE | Freq: Every day | ORAL | Status: DC

## 2014-08-16 MED ORDER — ESTRADIOL 1 MG PO TABS: 1.0000 mg | ORAL_TABLET | Freq: Every day | ORAL | Status: DC

## 2014-08-16 NOTE — Telephone Encounter (Signed)
Spoke with patient. Orders placed for Estrace 1mg  daily and Prometrium 100mg  daily to [facility] on file. Patient is agreeable and verbalizes understanding.  Routing to provider for final review. Patient agreeable to disposition. Will close encounter

## 2014-08-16 NOTE — Telephone Encounter (Signed)
Patient calling stating that she has restarted her Estrogen and Progesterone. Patient is requesting refills. See plan from last aex on 06/14/14 with Dr.Silva below. Patient was previously on Estrace 1 mg daily and Prometrium 100 mg days 1-10 of the month.   ASSESSMENT  Normal gynecologic exam.  Heavy cycles on periodic progesterone therapy for HRT.  No bleeding on continuous HRT.  Long standing microscopic hematuria.  PLAN  Mammogram recommended yearly starting at age 77.  Pap smear and high risk HPV testing as above.  Stop HRT.  Try Hoy Register.  Counseled on self breast exam, Calcium and vitamin D intake, exercise.  See lab orders: Yes. Urine micro and urine culture.  Referral to Urology.  TDap.  Return annually or prn  An After Visit Summary was printed and given to the patient.   Dr.Silva okay to refill HRT at this time?

## 2014-08-16 NOTE — Telephone Encounter (Signed)
OK for Estrace 1 mg daily and Prometrium 100 mg daily, OK for refills until annual exam in August 2016.  Call for bleeding.

## 2014-09-17 ENCOUNTER — Other Ambulatory Visit: Payer: Self-pay | Admitting: Otolaryngology

## 2014-09-17 DIAGNOSIS — R0981 Nasal congestion: Secondary | ICD-10-CM

## 2014-09-17 HISTORY — PX: NOSE SURGERY: SHX723

## 2014-09-25 ENCOUNTER — Ambulatory Visit
Admission: RE | Admit: 2014-09-25 | Discharge: 2014-09-25 | Disposition: A | Discharge status: Home / Self Care | Payer: PRIVATE HEALTH INSURANCE | Source: Ambulatory Visit | Attending: Otolaryngology | Admitting: Otolaryngology

## 2014-09-25 DIAGNOSIS — R0981 Nasal congestion: Secondary | ICD-10-CM

## 2015-04-03 ENCOUNTER — Telehealth: Payer: Self-pay | Admitting: Obstetrics and Gynecology

## 2015-04-03 NOTE — Telephone Encounter (Signed)
OK to put on schedule tomorrow.

## 2015-04-03 NOTE — Telephone Encounter (Signed)
Patient has a yeast infection. °

## 2015-04-03 NOTE — Telephone Encounter (Signed)
Spoke with patient. Patient is agreeable. Appointment scheduled for tomorrow at 8:45am with Dr.Miller. Agreeable to date and time.  Routing to provider for final review. Patient agreeable to disposition. Will close encounter.

## 2015-04-03 NOTE — Telephone Encounter (Signed)
Spoke with patient. Patient states she had vaginal itching and discharge for 1 week. Patient used OTC Monistat x3 days with some relief. Symptoms have returned and are worsening. "It is getting terrible. Monistat never seems to work for me." Patient is requesting rx for Diflucan. Advised evaluation in office is needed before rx can be given. Requesting appointment for tomorrow. Advised will need to speak with provider regarding scheduling and rx request and return call. Patient is agreeable.  Dr.Miller, please review and advise.

## 2015-04-04 ENCOUNTER — Ambulatory Visit (INDEPENDENT_AMBULATORY_CARE_PROVIDER_SITE_OTHER): Payer: PRIVATE HEALTH INSURANCE | Admitting: Obstetrics & Gynecology

## 2015-04-04 VITALS — BP 112/62 | HR 72 | Resp 16 | Wt 123.4 lb

## 2015-04-04 DIAGNOSIS — L298 Other pruritus: Secondary | ICD-10-CM | POA: Diagnosis not present

## 2015-04-04 DIAGNOSIS — N898 Other specified noninflammatory disorders of vagina: Secondary | ICD-10-CM

## 2015-04-04 MED ORDER — FLUCONAZOLE 150 MG PO TABS: 150.0000 mg | ORAL_TABLET | Freq: Once | ORAL | Status: DC

## 2015-04-04 MED ORDER — ESTROGENS, CONJUGATED 0.625 MG/GM VA CREA: TOPICAL_CREAM | VAGINAL | Status: DC

## 2015-04-04 NOTE — Progress Notes (Signed)
Subjective:     Patient ID: Allison Lang, female   DOB: 04/17/1956, 59 y.o.   MRN: [id number]  HPI 59 yo G0 (Adopted one son) here with several weeks of vaginal discharge and itching.  Used OTC Monistat without a lot of help.  States she thought it got a little better but has gradually worsened again.  Is getting ready to go to golf tournament her high school son is playing in and just wants some relief.  No urinary symptoms.  Discharge is whitish with no odor.  No fevers.  Denies pelvic pain.  Pt is on HRT.  No vaginal bleeding in about two years.  LMP 01/30/13.  Review of Systems  All other systems reviewed and are negative.      Objective:   Physical Exam  Constitutional: She appears well-developed and well-nourished.  Abdominal: Soft. Bowel sounds are normal.  Genitourinary: Uterus normal. There is no rash, tenderness or lesion on the right labia. There is no rash, tenderness or lesion on the left labia. Uterus is not tender. Right adnexum displays no mass and no tenderness. Left adnexum displays no mass and no tenderness. Vaginal discharge found.  Lymphadenopathy:       Right: No inguinal adenopathy present.       Left: No inguinal adenopathy present.  Skin: Skin is warm and dry.  Psychiatric: She has a normal mood and affect.   Wet smear:  Ph 4.5.  Saline:  Neg trich, neg clue cells.  KOH:  +Yeast, neg whiff    Assessment:     Yeast vaginitis, failed OTC products     Plan:     Diflucan 150mg  po x 1, repeat 48 hrs.  If not fully resolved, pt knows to call back for additional evaluation.

## 2015-04-16 ENCOUNTER — Encounter: Payer: Self-pay | Admitting: Obstetrics & Gynecology

## 2015-05-19 ENCOUNTER — Other Ambulatory Visit: Payer: Self-pay

## 2015-05-19 DIAGNOSIS — Z1231 Encounter for screening mammogram for malignant neoplasm of breast: Secondary | ICD-10-CM

## 2015-06-18 ENCOUNTER — Other Ambulatory Visit: Payer: Self-pay | Admitting: *Deleted

## 2015-06-18 DIAGNOSIS — N951 Menopausal and female climacteric states: Secondary | ICD-10-CM

## 2015-06-18 MED ORDER — PROGESTERONE MICRONIZED 100 MG PO CAPS: 100.0000 mg | ORAL_CAPSULE | Freq: Every day | ORAL | Status: DC

## 2015-06-18 MED ORDER — ESTRADIOL 1 MG PO TABS: 1.0000 mg | ORAL_TABLET | Freq: Every day | ORAL | Status: DC

## 2015-06-18 NOTE — Telephone Encounter (Signed)
Faxed Medication refill request from [facility]: Estradiol 1 mg ; Prometrium 100 mg Last AEX:  06/14/14 with BS Next AEX:  06/27/15 with BS  Last MMG (if hormonal medication request):  06/18/2014 breast density category c; bi-rads 1: negative Refill authorized: #90 for both?

## 2015-06-25 ENCOUNTER — Ambulatory Visit
Admission: RE | Admit: 2015-06-25 | Discharge: 2015-06-25 | Disposition: A | Discharge status: Home / Self Care | Payer: PRIVATE HEALTH INSURANCE | Source: Ambulatory Visit

## 2015-06-25 DIAGNOSIS — Z1231 Encounter for screening mammogram for malignant neoplasm of breast: Secondary | ICD-10-CM

## 2015-06-27 ENCOUNTER — Ambulatory Visit: Payer: PRIVATE HEALTH INSURANCE | Admitting: Obstetrics and Gynecology

## 2015-06-30 ENCOUNTER — Encounter: Payer: Self-pay | Admitting: Obstetrics and Gynecology

## 2015-06-30 ENCOUNTER — Ambulatory Visit (INDEPENDENT_AMBULATORY_CARE_PROVIDER_SITE_OTHER): Payer: PRIVATE HEALTH INSURANCE | Admitting: Obstetrics and Gynecology

## 2015-06-30 VITALS — BP 106/66 | HR 72 | Resp 16 | Ht 64.0 in | Wt 124.0 lb

## 2015-06-30 DIAGNOSIS — Z79890 Hormone replacement therapy (postmenopausal): Secondary | ICD-10-CM | POA: Diagnosis not present

## 2015-06-30 DIAGNOSIS — N951 Menopausal and female climacteric states: Secondary | ICD-10-CM

## 2015-06-30 DIAGNOSIS — Z124 Encounter for screening for malignant neoplasm of cervix: Secondary | ICD-10-CM

## 2015-06-30 DIAGNOSIS — Z01419 Encounter for gynecological examination (general) (routine) without abnormal findings: Secondary | ICD-10-CM | POA: Diagnosis not present

## 2015-06-30 MED ORDER — ESTRADIOL 0.5 MG PO TABS: 0.5000 mg | ORAL_TABLET | Freq: Every day | ORAL | Status: DC

## 2015-06-30 MED ORDER — PROGESTERONE MICRONIZED 100 MG PO CAPS: 100.0000 mg | ORAL_CAPSULE | Freq: Every day | ORAL | Status: DC

## 2015-06-30 NOTE — Patient Instructions (Signed)

## 2015-06-30 NOTE — Progress Notes (Signed)
59 y.o. G0P0 SingleCaucasianF here for annual exam.  The patient is on daily HRT. She is willing to go down on the dose. No vasomotor symptoms. No vaginal bleeding. Slight vaginal dryness, doesn't need a lubricant. Same partner x 5 years, no dyspareunia. Her only son just went off to college, doing well.     Patient's last menstrual period was 01/30/2013.          Sexually active: Yes.    The current method of family planning is post menopausal status.    Exercising: Yes.    Walking, golf, skiing.  Smoker:  no  Health Maintenance: Pap:  03/17/12 Neg. HR HPV:neg History of abnormal Pap:  no MMG:  06/25/15 BIRADS1:neg Colonoscopy:  10/18/06 Dr. Collene Mares - Normal BMD:   08/24/05 Normal TDaP:  06/14/14 Gardasil: N/A   reports that she has never smoked. She has never used smokeless tobacco. She reports that she drinks about 2.0 - 2.5 oz of alcohol per week. She reports that she does not use illicit drugs. She is a Chief Executive Officer.   Past Medical History  Diagnosis Date  . MVP (mitral valve prolapse)   . Microscopic hematuria   . MVP (mitral valve prolapse)     no pretreatment needed - never had any problems  . Seasonal allergies     Past Surgical History  Procedure Laterality Date  . Rhinoplasty  1998    and septoplasty  . Left foot surgery      with screws  . Wisdom tooth extraction    . Dilatation & currettage/hysteroscopy with resectocope N/A 06/11/2013    Procedure: DILATATION & CURETTAGE/HYSTEROSCOPY WITH RESECTOCOPE;  Surgeon: Peri Maris, MD;  Location: [facility];  Service: Gynecology;  Laterality: N/A;  . Nose surgery  09/2014     Sinus     Current Outpatient Prescriptions  Medication Sig Dispense Refill  . cholecalciferol (VITAMIN D) 1000 UNITS tablet Take 1,000 Units by mouth daily.    Marland Kitchen estradiol (ESTRACE) 1 MG tablet Take 1 tablet (1 mg total) by mouth daily. 90 tablet 0  . Multiple Vitamin (MULTIVITAMIN) tablet Take 1 tablet by mouth daily.    . progesterone (PROMETRIUM) 100 MG  capsule Take 1 capsule (100 mg total) by mouth daily. 90 capsule 0  . vitamin C (ASCORBIC ACID) 500 MG tablet Take 500 mg by mouth daily.    . fluticasone (FLONASE) 50 MCG/ACT nasal spray Place 2 sprays into both nostrils daily. As needed  5   No current facility-administered medications for this visit.    Family History  Problem Relation Age of Onset  . Cancer Father   . Liver disease Sister     will need transplant   Sister is stable, hasn't had the transplant. Father had non-hodgkin's lymphoma  Review of Systems  All other systems reviewed and are negative.   Exam:   BP 106/66 mmHg  Pulse 72  Resp 16  Ht 5\' 4"  (1.626 m)  Wt 124 lb (56.246 kg)  BMI 21.27 kg/m2  LMP 01/30/2013  Weight change: @WEIGHTCHANGE @ Height:   Height: 5\' 4"  (162.6 cm)  Ht Readings from Last 3 Encounters:  06/30/15 5\' 4"  (1.626 m)  08/06/13 5\' 4"  (1.626 m)  07/05/13 5\' 4"  (1.626 m)    General appearance: alert, cooperative and appears stated age Head: Normocephalic, without obvious abnormality, atraumatic Neck: no adenopathy, supple, symmetrical, trachea midline and thyroid normal to inspection and palpation Lungs: clear to auscultation bilaterally Breasts: normal appearance, no masses or tenderness  Heart: regular rate and rhythm Abdomen: soft, non-tender; bowel sounds normal; no masses,  no organomegaly Extremities: extremities normal, atraumatic, no cyanosis or edema Skin: Skin color, texture, turgor normal. No rashes or lesions Lymph nodes: Cervical, supraclavicular, and axillary nodes normal. No abnormal inguinal nodes palpated Neurologic: Grossly normal   Pelvic: External genitalia:  no lesions              Urethra:  normal appearing urethra with no masses, tenderness or lesions              Bartholins and Skenes: normal                 Vagina: normal appearing vagina with normal color and discharge, no lesions. Atrophic.              Cervix: no lesions               Bimanual Exam:   Uterus:  normal size, contour, position, consistency, mobility, non-tender and retroverted              Adnexa: no mass, fullness, tenderness               Rectovaginal: Confirms               Anus:  normal sphincter tone, no lesions  Chaperone was present for exam.  A:  Well Woman with normal exam  HRT, will decrease her dose of estradiol  P:    Pap with hpv  UTD on mammogram  Decrease estrogen dose, continue prometrium  She thinks she is due for a colonoscopy, she will call to double check

## 2015-07-02 LAB — IPS PAP TEST WITH HPV

## 2016-06-04 ENCOUNTER — Other Ambulatory Visit: Payer: Self-pay | Admitting: Obstetrics and Gynecology

## 2016-06-04 DIAGNOSIS — Z1231 Encounter for screening mammogram for malignant neoplasm of breast: Secondary | ICD-10-CM

## 2016-06-29 ENCOUNTER — Ambulatory Visit
Admission: RE | Admit: 2016-06-29 | Discharge: 2016-06-29 | Disposition: A | Discharge status: Home / Self Care | Payer: PRIVATE HEALTH INSURANCE | Source: Ambulatory Visit | Attending: Obstetrics and Gynecology | Admitting: Obstetrics and Gynecology

## 2016-06-29 DIAGNOSIS — Z1231 Encounter for screening mammogram for malignant neoplasm of breast: Secondary | ICD-10-CM

## 2016-07-07 ENCOUNTER — Ambulatory Visit: Payer: PRIVATE HEALTH INSURANCE | Admitting: Obstetrics and Gynecology

## 2016-07-26 ENCOUNTER — Encounter: Payer: Self-pay | Admitting: Obstetrics and Gynecology

## 2016-07-26 ENCOUNTER — Ambulatory Visit (INDEPENDENT_AMBULATORY_CARE_PROVIDER_SITE_OTHER): Payer: PRIVATE HEALTH INSURANCE | Admitting: Obstetrics and Gynecology

## 2016-07-26 VITALS — BP 112/60 | HR 60 | Resp 14 | Ht 64.5 in | Wt 118.0 lb

## 2016-07-26 DIAGNOSIS — Z01419 Encounter for gynecological examination (general) (routine) without abnormal findings: Secondary | ICD-10-CM | POA: Diagnosis not present

## 2016-07-26 DIAGNOSIS — N951 Menopausal and female climacteric states: Secondary | ICD-10-CM | POA: Diagnosis not present

## 2016-07-26 DIAGNOSIS — Z79890 Hormone replacement therapy (postmenopausal): Secondary | ICD-10-CM | POA: Diagnosis not present

## 2016-07-26 MED ORDER — PROGESTERONE MICRONIZED 100 MG PO CAPS: 100.0000 mg | ORAL_CAPSULE | Freq: Every day | ORAL | 3 refills | Status: DC

## 2016-07-26 MED ORDER — ESTRADIOL 0.5 MG PO TABS: 0.5000 mg | ORAL_TABLET | Freq: Every day | ORAL | 3 refills | Status: DC

## 2016-07-26 NOTE — Progress Notes (Signed)
60 y.o. G1P0011 SingleCaucasianF here for annual exam.   She does have some warm flashes at night in the last 2 months. Woken up a couple of times a week. Fine during the day. No vaginal bleeding. Sexually active, lives with her partner. No dyspareunia.     Patient's last menstrual period was 01/30/2013.          Sexually active: Yes.    The current method of family planning is post menopausal status.    Exercising: Yes.    golf, bikie, strength training, sking  Smoker:  no  Health Maintenance: Pap:  06-30-15 WNL NEG HR HPV  History of abnormal Pap:  no MMG:  06-29-16 WNL Colonoscopy: 10/2015 polyps Dr. Collene Mares, f/u in 5 years BMD:   08/24/05 WNL TDaP:  06-14-14 Gardasil: N/A   reports that she has never smoked. She has never used smokeless tobacco. She reports that she drinks about 2.0 - 2.5 oz of alcohol per week . 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 She is very active, skiing, golfing. Lives with her partner.  Past Medical History:  Diagnosis Date  . Microscopic hematuria   . MVP (mitral valve prolapse)   . MVP (mitral valve prolapse)    no pretreatment needed - never had any problems  . Seasonal allergies     Past Surgical History:  Procedure Laterality Date  . DILATATION & CURRETTAGE/HYSTEROSCOPY WITH RESECTOCOPE N/A 06/11/2013   Procedure: [place];  Surgeon: Peri Maris, MD;  Location: [facility];  Service: Gynecology;  Laterality: N/A;  . left foot surgery     with screws  . NOSE SURGERY  09/2014    Sinus   . RHINOPLASTY  1998   and septoplasty  . WISDOM TOOTH EXTRACTION      Current Outpatient Prescriptions  Medication Sig Dispense Refill  . cholecalciferol (VITAMIN D) 1000 UNITS tablet Take 1,000 Units by mouth daily.    Marland Kitchen estradiol (ESTRACE) 0.5 MG tablet Take 1 tablet (0.5 mg total) by mouth daily. 90 tablet 3  . fluticasone (FLONASE) 50 MCG/ACT nasal spray Place 2  sprays into both nostrils daily. As needed  5  . Multiple Vitamin (MULTIVITAMIN) tablet Take 1 tablet by mouth daily.    . progesterone (PROMETRIUM) 100 MG capsule Take 1 capsule (100 mg total) by mouth daily. 90 capsule 3  . vitamin C (ASCORBIC ACID) 500 MG tablet Take 500 mg by mouth daily.     No current facility-administered medications for this visit.     Family History  Problem Relation Age of Onset  . Cancer Father   . Liver disease Sister     will need transplant  Mom died of heart disease, but had mild lung cancer (smoker)  Review of Systems  Constitutional: Negative.   HENT: Negative.   Eyes: Negative.   Respiratory: Negative.   Cardiovascular: Negative.   Gastrointestinal: Negative.   Endocrine: Negative.   Genitourinary: Negative.        "warm flashes" at night   Musculoskeletal: Negative.   Skin: Negative.   Allergic/Immunologic: Negative.   Neurological: Negative.   Psychiatric/Behavioral: Negative.     Exam:   BP 112/60 (BP Location: Right Arm, Patient Position: Sitting, Cuff Size: Normal)   Pulse 60   Resp 14   Ht 5' 4.5" (1.638 m)   Wt 118 lb (53.5 kg)   LMP 01/30/2013   BMI 19.94 kg/m  Weight change: @WEIGHTCHANGE @ Height:   Height: 5' 4.5" (163.8 cm)  Ht Readings from Last 3 Encounters:  07/26/16 5' 4.5" (1.638 m)  06/30/15 5\' 4"  (1.626 m)  08/06/13 5\' 4"  (1.626 m)    General appearance: alert, cooperative and appears stated age Head: Normocephalic, without obvious abnormality, atraumatic Neck: no adenopathy, supple, symmetrical, trachea midline and thyroid normal to inspection and palpation Lungs: clear to auscultation bilaterally Breasts: normal appearance, no masses or tenderness Heart: regular rate and rhythm Abdomen: soft, non-tender; bowel sounds normal; no masses,  no organomegaly Extremities: extremities normal, atraumatic, no cyanosis or edema Skin: Skin color, texture, turgor normal. No rashes or lesions Lymph nodes: Cervical,  supraclavicular, and axillary nodes normal. No abnormal inguinal nodes palpated Neurologic: Grossly normal   Pelvic: External genitalia:  no lesions              Urethra:  normal appearing urethra with no masses, tenderness or lesions              Bartholins and Skenes: normal                 Vagina: normal appearing atrophic vagina with normal color and discharge, no lesions              Cervix: no lesions               Bimanual Exam:  Uterus:  normal size, contour, position, consistency, mobility, non-tender              Adnexa: no mass, fullness, tenderness               Rectovaginal: Confirms               Anus:  normal sphincter tone, no lesions  Chaperone was present for exam.  A:  Well Woman with normal exam  On HRT, wants to continue, aware of risks  P:   Continue HRT  Discussed breast self exam  Discussed calcium and vit D intake  No pap this year  Mammogram and colonoscopy UTD  Labs with primary

## 2016-07-26 NOTE — Patient Instructions (Signed)
EXERCISE AND DIET:  We recommended that you start or continue a regular exercise program for good health. Regular exercise means any activity that makes your heart beat faster and makes you sweat.  We recommend exercising at least 30 minutes per day at least 3 days a week, preferably 4 or 5.  We also recommend a diet low in fat and sugar.  Inactivity, poor dietary choices and obesity can cause diabetes, heart attack, stroke, and kidney damage, among others.    ALCOHOL AND SMOKING:  Women should limit their alcohol intake to no more than 7 drinks/beers/glasses of wine (combined, not each!) per week. Moderation of alcohol intake to this level decreases your risk of breast cancer and liver damage. And of course, no recreational drugs are part of a healthy lifestyle.  And absolutely no smoking or even second hand smoke. Most people know smoking can cause heart and lung diseases, but did you know it also contributes to weakening of your bones? Aging of your skin?  Yellowing of your teeth and nails?  CALCIUM AND VITAMIN D:  Adequate intake of calcium and Vitamin D are recommended.  The recommendations for exact amounts of these supplements seem to change often, but generally speaking 600 mg of calcium (either carbonate or citrate) and 800 units of Vitamin D per day seems prudent. Certain women may benefit from higher intake of Vitamin D.  If you are among these women, your doctor will have told you during your visit.    PAP SMEARS:  Pap smears, to check for cervical cancer or precancers,  have traditionally been done yearly, although recent scientific advances have shown that most women can have pap smears less often.  However, every woman still should have a physical exam from her gynecologist every year. It will include a breast check, inspection of the vulva and vagina to check for abnormal growths or skin changes, a visual exam of the cervix, and then an exam to evaluate the size and shape of the uterus and  ovaries.  And after 61 years of age, a rectal exam is indicated to check for rectal cancers. We will also provide age appropriate advice regarding health maintenance, like when you should have certain vaccines, screening for sexually transmitted diseases, bone density testing, colonoscopy, mammograms, etc.   MAMMOGRAMS:  All women over 40 years old should have a yearly mammogram. Many facilities now offer a "3D" mammogram, which may cost around $50 extra out of pocket. If possible,  we recommend you accept the option to have the 3D mammogram performed.  It both reduces the number of women who will be called back for extra views which then turn out to be normal, and it is better than the routine mammogram at detecting truly abnormal areas.    COLONOSCOPY:  Colonoscopy to screen for colon cancer is recommended for all women at age 50.  We know, you hate the idea of the prep.  We agree, BUT, having colon cancer and not knowing it is worse!!  Colon cancer so often starts as a polyp that can be seen and removed at colonscopy, which can quite literally save your life!  And if your first colonoscopy is normal and you have no family history of colon cancer, most women don't have to have it again for 10 years.  Once every ten years, you can do something that may end up saving your life, right?  We will be happy to help you get it scheduled when you are ready.    Be sure to check your insurance coverage so you understand how much it will cost.  It may be covered as a preventative service at no cost, but you should check your particular policy.     Hormone Therapy At menopause, your body begins making less estrogen and progesterone hormones. This causes the body to stop having menstrual periods. This is because estrogen and progesterone hormones control your periods and menstrual cycle. A lack of estrogen may cause symptoms such as:  Hot flushes (or hot flashes).  Vaginal dryness.  Dry skin.  Loss of sex  drive.  Risk of bone loss (osteoporosis). When this happens, you may choose to take hormone therapy to get back the estrogen lost during menopause. When the hormone estrogen is given alone, it is usually referred to as ET (Estrogen Therapy). When the hormone progestin is combined with estrogen, it is generally called HT (Hormone Therapy). This was formerly known as hormone replacement therapy (HRT). Your caregiver can help you make a decision on what will be best for you. The decision to use HT seems to change often as new studies are done. Many studies do not agree on the benefits of hormone replacement therapy. LIKELY BENEFITS OF HT INCLUDE PROTECTION FROM:  Hot Flushes (also called hot flashes) - A hot flush is a sudden feeling of heat that spreads over the face and body. The skin may redden like a blush. It is connected with sweats and sleep disturbance. Women going through menopause may have hot flushes a few times a month or several times per day depending on the woman.  Osteoporosis (bone loss) - Estrogen helps guard against bone loss. After menopause, a woman's bones slowly lose calcium and become weak and brittle. As a result, bones are more likely to break. The hip, wrist, and spine are affected most often. Hormone therapy can help slow bone loss after menopause. Weight bearing exercise and taking calcium with vitamin D also can help prevent bone loss. There are also medications that your caregiver can prescribe that can help prevent osteoporosis.  Vaginal dryness - Loss of estrogen causes changes in the vagina. Its lining may become thin and dry. These changes can cause pain and bleeding during sexual intercourse. Dryness can also lead to infections. This can cause burning and itching. (Vaginal estrogen treatment can help relieve pain, itching, and dryness.)  Urinary tract infections are more common after menopause because of lack of estrogen. Some women also develop urinary incontinence  because of low estrogen levels in the vagina and bladder.  Possible other benefits of estrogen include a positive effect on mood and short-term memory in women. RISKS AND COMPLICATIONS  Using estrogen alone without progesterone causes the lining of the uterus to grow. This increases the risk of lining of the uterus (endometrial) cancer. Your caregiver should give another hormone called progestin if you have a uterus.  Women who take combined (estrogen and progestin) HT appear to have an increased risk of breast cancer. The risk appears to be small, but increases throughout the time that HT is taken.  Combined therapy also makes the breast tissue slightly denser which makes it harder to read mammograms (breast X-rays).  Combined, estrogen and progesterone therapy can be taken together every day, in which case there may be spotting of blood. HT therapy can be taken cyclically in which case you will have menstrual periods. Cyclically means HT is taken for a set amount of days, then not taken, then this process is repeated.  HT may increase the risk of stroke, heart attack, breast cancer and forming blood clots in your leg.  Transdermal estrogen (estrogen that is absorbed through the skin with a patch or a cream) may have better results with:  Cholesterol.  Blood pressure.  Blood clots. Having the following conditions may indicate you should not have HT:  Endometrial cancer.  Liver disease.  Breast cancer.  Heart disease.  History of blood clots.  Stroke. TREATMENT   If you choose to take HT and have a uterus, usually estrogen and progestin are prescribed.  Your caregiver will help you decide the best way to take the medications.  Possible ways to take estrogen include:  Pills.  Patches.  Gels.  Sprays.  Vaginal estrogen cream, rings and tablets.  It is best to take the lowest dose possible that will help your symptoms and take them for the shortest period of time  that you can.  Hormone therapy can help relieve some of the problems (symptoms) that affect women at menopause. Before making a decision about HT, talk to your caregiver about what is best for you. Be well informed and comfortable with your decisions. HOME CARE INSTRUCTIONS   Follow your caregivers advice when taking the medications.  A Pap test is done to screen for cervical cancer.  The first Pap test should be done at age 87.  Between ages 38 and 39, Pap tests are repeated every 2 years.  Beginning at age 59, you are advised to have a Pap test every 3 years as long as the past 3 Pap tests have been normal.  Some women have medical problems that increase the chance of getting cervical cancer. Talk to your caregiver about these problems. It is especially important to talk to your caregiver if a new problem develops soon after your last Pap test. In these cases, your caregiver may recommend more frequent screening and Pap tests.  The above recommendations are the same for women who have or have not gotten the vaccine for HPV (human papillomavirus).  If you had a hysterectomy for a problem that was not a cancer or a condition that could lead to cancer, then you no longer need Pap tests. However, even if you no longer need a Pap test, a regular exam is a good idea to make sure no other problems are starting.  If you are between ages 40 and 42, and you have had normal Pap tests going back 10 years, you no longer need Pap tests. However, even if you no longer need a Pap test, a regular exam is a good idea to make sure no other problems are starting.  If you have had past treatment for cervical cancer or a condition that could lead to cancer, you need Pap tests and screening for cancer for at least 20 years after your treatment.  If Pap tests have been discontinued, risk factors (such as a new sexual partner)need to be re-assessed to determine if screening should be resumed.  Some women may  need screenings more often if they are at high risk for cervical cancer.  Get mammograms done as per the advice of your caregiver. SEEK IMMEDIATE MEDICAL CARE IF:  You develop abnormal vaginal bleeding.  You have pain or swelling in your legs, shortness of breath, or chest pain.  You develop dizziness or headaches.  You have lumps or changes in your breasts or armpits.  You have slurred speech.  You develop weakness or numbness of your arms  or legs.  You have pain, burning, or bleeding when urinating.  You develop abdominal pain.   This information is not intended to replace advice given to you by your health care provider. Make sure you discuss any questions you have with your health care provider.   Document Released: 07/03/2003 Document Revised: 02/18/2015 Document Reviewed: 04/07/2015 Elsevier Interactive Patient Education [employer].

## 2016-09-04 ENCOUNTER — Other Ambulatory Visit: Payer: Self-pay | Admitting: Obstetrics and Gynecology

## 2016-09-04 DIAGNOSIS — N951 Menopausal and female climacteric states: Secondary | ICD-10-CM

## 2016-09-22 ENCOUNTER — Other Ambulatory Visit: Payer: Self-pay | Admitting: Obstetrics and Gynecology

## 2017-04-11 ENCOUNTER — Ambulatory Visit
Admission: RE | Admit: 2017-04-11 | Discharge: 2017-04-11 | Disposition: A | Discharge status: Home / Self Care | Payer: PRIVATE HEALTH INSURANCE | Source: Ambulatory Visit | Attending: Internal Medicine | Admitting: Internal Medicine

## 2017-04-11 ENCOUNTER — Other Ambulatory Visit: Payer: Self-pay | Admitting: Internal Medicine

## 2017-04-11 DIAGNOSIS — M79644 Pain in right finger(s): Secondary | ICD-10-CM

## 2017-04-11 DIAGNOSIS — E559 Vitamin D deficiency, unspecified: Secondary | ICD-10-CM

## 2017-05-27 ENCOUNTER — Other Ambulatory Visit: Payer: Self-pay | Admitting: Internal Medicine

## 2017-05-27 DIAGNOSIS — Z1231 Encounter for screening mammogram for malignant neoplasm of breast: Secondary | ICD-10-CM

## 2017-07-13 ENCOUNTER — Ambulatory Visit
Admission: RE | Admit: 2017-07-13 | Discharge: 2017-07-13 | Disposition: A | Discharge status: Home / Self Care | Payer: PRIVATE HEALTH INSURANCE | Source: Ambulatory Visit | Attending: Internal Medicine | Admitting: Internal Medicine

## 2017-07-13 DIAGNOSIS — Z1231 Encounter for screening mammogram for malignant neoplasm of breast: Secondary | ICD-10-CM

## 2017-07-14 ENCOUNTER — Ambulatory Visit: Payer: PRIVATE HEALTH INSURANCE

## 2017-08-03 ENCOUNTER — Encounter: Payer: Self-pay | Admitting: Obstetrics and Gynecology

## 2017-08-03 ENCOUNTER — Ambulatory Visit (INDEPENDENT_AMBULATORY_CARE_PROVIDER_SITE_OTHER): Payer: 59 | Admitting: Obstetrics and Gynecology

## 2017-08-03 VITALS — BP 102/64 | HR 80 | Resp 14 | Ht 64.0 in | Wt 129.0 lb

## 2017-08-03 DIAGNOSIS — Z01419 Encounter for gynecological examination (general) (routine) without abnormal findings: Secondary | ICD-10-CM

## 2017-08-03 NOTE — Progress Notes (Signed)
61 y.o. G1P0011 SingleCaucasianF here for annual exam.   She has been waking up 1-2 x a night with "warm flashes", not sleeping well, then tired. Work has been crazy. Exercise has gone down secondary to the fatigue. Up 11 lbs since last year.  She is taking some melatonin, which is helping some. Work will be slowing down some. She is a Chief Executive Officer, works for a firm.  Lives with her partner, no dyspareunia. No vaginal bleeding.     Patient's last menstrual period was 01/30/2013.          Sexually active: Yes.    The current method of family planning is post menopausal status.    Exercising: Yes.    walking, sking, weights, golf and cycling  Smoker:  no  Health Maintenance: Pap:  06-30-15 WNL NEG HR HPV History of abnormal Pap:  no MMG:  07-13-17 WNL  Colonoscopy:  10/2015 WNL Dr Collene Mares BMD:   08-24-05 WNL TDaP:  06-14-14 Gardasil: N/A   reports that she has never smoked. She has never used smokeless tobacco. She reports that she drinks about 3.6 oz of alcohol per week . She reports that she does not use drugs.Son is a Paramedic in college, on the golf team. She is a Chief Executive Officer Secretary/administrator), very physically active.   Past Medical History:  Diagnosis Date  . Microscopic hematuria   . MVP (mitral valve prolapse)   . MVP (mitral valve prolapse)    no pretreatment needed - never had any problems  . Seasonal allergies     Past Surgical History:  Procedure Laterality Date  . DILATATION & CURRETTAGE/HYSTEROSCOPY WITH RESECTOCOPE N/A 06/11/2013   Procedure: [place];  Surgeon: Peri Maris, MD;  Location: [facility];  Service: Gynecology;  Laterality: N/A;  . left foot surgery     with screws  . NOSE SURGERY  09/2014    Sinus   . RHINOPLASTY  1998   and septoplasty  . WISDOM TOOTH EXTRACTION      Current Outpatient Prescriptions  Medication Sig Dispense Refill  . cholecalciferol (VITAMIN D) 1000 UNITS tablet Take 1,000 Units by mouth daily.    Marland Kitchen estradiol  (ESTRACE) 0.5 MG tablet Take 1 tablet (0.5 mg total) by mouth daily. 90 tablet 3  . fluticasone (FLONASE) 50 MCG/ACT nasal spray Place 2 sprays into both nostrils daily. As needed  5  . Multiple Vitamin (MULTIVITAMIN) tablet Take 1 tablet by mouth daily.    . progesterone (PROMETRIUM) 100 MG capsule Take 1 capsule (100 mg total) by mouth daily. 90 capsule 3  . vitamin C (ASCORBIC ACID) 500 MG tablet Take 500 mg by mouth daily.     No current facility-administered medications for this visit.     Family History  Problem Relation Age of Onset  . Cancer Father   . Liver disease Sister        will need transplant    Review of Systems  Constitutional: Negative.   HENT: Negative.   Eyes: Negative.   Respiratory: Negative.   Cardiovascular: Negative.   Gastrointestinal: Negative.   Endocrine: Negative.   Genitourinary:       Hot flashes at night  Musculoskeletal: Negative.   Skin: Negative.   Allergic/Immunologic: Negative.   Neurological: Negative.   Psychiatric/Behavioral: Negative.     Exam:   BP 102/64 (BP Location: Right Arm, Patient Position: Sitting, Cuff Size: Normal)   Pulse 80   Resp 14   Ht 5\' 4"  (1.626 m)  Wt 129 lb (58.5 kg)   LMP 01/30/2013   BMI 22.14 kg/m   Weight change: @WEIGHTCHANGE @ Height:   Height: 5\' 4"  (162.6 cm)  Ht Readings from Last 3 Encounters:  08/03/17 5\' 4"  (1.626 m)  07/26/16 5' 4.5" (1.638 m)  06/30/15 5\' 4"  (1.626 m)    General appearance: alert, cooperative and appears stated age Head: Normocephalic, without obvious abnormality, atraumatic Neck: no adenopathy, supple, symmetrical, trachea midline and thyroid normal to inspection and palpation Lungs: clear to auscultation bilaterally Cardiovascular: regular rate and rhythm Breasts: normal appearance, no masses or tenderness Abdomen: soft, non-tender; non distended,  no masses,  no organomegaly Extremities: extremities normal, atraumatic, no cyanosis or edema Skin: Skin color,  texture, turgor normal. No rashes or lesions Lymph nodes: Cervical, supraclavicular, and axillary nodes normal. No abnormal inguinal nodes palpated Neurologic: Grossly normal   Pelvic: External genitalia:  no lesions              Urethra:  normal appearing urethra with no masses, tenderness or lesions              Bartholins and Skenes: normal                 Vagina: normal appearing vagina with normal color and discharge, no lesions              Cervix: no lesions               Bimanual Exam:  Uterus:  normal size, contour, position, consistency, mobility, non-tender              Adnexa: no mass, fullness, tenderness               Rectovaginal: Confirms               Anus:  normal sphincter tone, no lesions  Chaperone was present for exam.  A:  Well Woman with normal exam  P:   Pap next year  Labs with primary  Discussed breast self exam  Discussed calcium and vit D intake  Mammogram and colonoscopy UTD

## 2017-08-03 NOTE — Patient Instructions (Signed)

## 2017-09-18 ENCOUNTER — Other Ambulatory Visit: Payer: Self-pay | Admitting: Obstetrics and Gynecology

## 2017-09-18 DIAGNOSIS — N951 Menopausal and female climacteric states: Secondary | ICD-10-CM

## 2017-09-19 NOTE — Telephone Encounter (Signed)
Medication refill request: Estradiol and Progesterone  Last AEX:  08/03/17 JJ Next AEX: 08/23/18  Last MMG (if hormonal medication request): 07/13/17 BIRADS 1 negative/density c Refill authorized: 07/26/16 #90 w/3 refills; today please advise; Dr. Talbert Nan out office 09/19/17

## 2018-03-14 ENCOUNTER — Other Ambulatory Visit: Payer: Self-pay | Admitting: Obstetrics & Gynecology

## 2018-05-16 DIAGNOSIS — M26609 Unspecified temporomandibular joint disorder, unspecified side: Secondary | ICD-10-CM | POA: Insufficient documentation

## 2018-06-16 ENCOUNTER — Other Ambulatory Visit: Payer: Self-pay | Admitting: Internal Medicine

## 2018-06-16 DIAGNOSIS — Z1231 Encounter for screening mammogram for malignant neoplasm of breast: Secondary | ICD-10-CM

## 2018-06-23 ENCOUNTER — Other Ambulatory Visit: Payer: Self-pay | Admitting: Obstetrics & Gynecology

## 2018-06-23 DIAGNOSIS — N951 Menopausal and female climacteric states: Secondary | ICD-10-CM

## 2018-06-23 NOTE — Telephone Encounter (Signed)
Medication refill request: Progesterone 100 mg cap  Last AEX:  08/03/17 Next AEX: 08/23/18 with JJ Last MMG (if hormonal medication request): 07/13/17 Bi-rads category 1 neg  Refill authorized: Please refill until annual exam if appropriate.

## 2018-06-23 NOTE — Telephone Encounter (Signed)
3 month supply sent

## 2018-07-21 ENCOUNTER — Ambulatory Visit
Admission: RE | Admit: 2018-07-21 | Discharge: 2018-07-21 | Disposition: A | Discharge status: Home / Self Care | Payer: 59 | Source: Ambulatory Visit | Attending: Internal Medicine | Admitting: Internal Medicine

## 2018-07-21 DIAGNOSIS — Z1231 Encounter for screening mammogram for malignant neoplasm of breast: Secondary | ICD-10-CM

## 2018-08-21 NOTE — Progress Notes (Signed)
62 y.o. G1P0011 Single White or Caucasian Not Hispanic or Latino female here for annual exam.  She has gained a little weight, needs to exercise more. No vaginal bleeding. Sexually active, slight discomfort, the vagina is dry. Hasn't tried a lubricant. She can get UTI's with sex.  No bowel or bladder c/o. She is on HRT, doing okay. Very mild vasomotor symptoms.    Patient's last menstrual period was 01/30/2013.          Sexually active: Yes.    The current method of family planning is post menopausal status.    Exercising: Yes.    walking, body pump Smoker:  no  Health Maintenance: Pap:  06-30-15 WNL NEG HR HPV History of abnormal Pap:  no MMG:  07/21/2018 Birads 1 negative Colonoscopy:  10/2015 WNL Dr Collene Mares BMD:   08-24-05 WNL TDaP:  06-14-14 Gardasil: N/A   reports that she has never smoked. She has never used smokeless tobacco. She reports that she drinks about 4.0 standard drinks of alcohol per week. She reports that she does not use drugs. 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 She golf's and ski's  Son is a Equities trader at [employer], plays golf for the school.   Past Medical History:  Diagnosis Date  . Microscopic hematuria   . MVP (mitral valve prolapse)   . MVP (mitral valve prolapse)    no pretreatment needed - never had any problems  . Seasonal allergies     Past Surgical History:  Procedure Laterality Date  . DILATATION & CURRETTAGE/HYSTEROSCOPY WITH RESECTOCOPE N/A 06/11/2013   Procedure: [place];  Surgeon: Peri Maris, MD;  Location: [facility];  Service: Gynecology;  Laterality: N/A;  . left foot surgery     with screws  . NOSE SURGERY  09/2014    Sinus   . RHINOPLASTY  1998   and septoplasty  . WISDOM TOOTH EXTRACTION      Current Outpatient Medications  Medication Sig Dispense Refill  . cholecalciferol (VITAMIN D) 1000 UNITS tablet Take 1,000 Units by mouth daily.    Marland Kitchen estradiol (ESTRACE) 0.5  MG tablet TAKE 1 TABLET (0.5 MG TOTAL) BY MOUTH DAILY. 90 tablet 2  . fluticasone (FLONASE) 50 MCG/ACT nasal spray Place 2 sprays into both nostrils daily. As needed  5  . Multiple Vitamin (MULTIVITAMIN) tablet Take 1 tablet by mouth daily.    . progesterone (PROMETRIUM) 100 MG capsule TAKE 1 CAPSULE BY MOUTH EVERY DAY 90 capsule 0  . vitamin C (ASCORBIC ACID) 500 MG tablet Take 500 mg by mouth daily.     No current facility-administered medications for this visit.     Family History  Problem Relation Age of Onset  . Cancer Father   . Liver disease Sister        will need transplant    Review of Systems  Constitutional: Negative.   HENT: Negative.   Eyes: Negative.   Respiratory: Negative.   Cardiovascular: Negative.   Gastrointestinal: Negative.   Endocrine: Negative.   Genitourinary: Negative.   Musculoskeletal: Negative.   Skin: Negative.   Allergic/Immunologic: Negative.   Neurological: Negative.   Hematological: Negative.   Psychiatric/Behavioral: Negative.     Exam:   BP 108/70 (BP Location: Right Arm, Patient Position: Sitting, Cuff Size: Normal)   Pulse 76   Ht 5' 4.17" (1.63 m)   Wt 131 lb (59.4 kg)   LMP 01/30/2013   BMI 22.37 kg/m   Weight  change: @WEIGHTCHANGE @ Height:   Height: 5' 4.17" (163 cm)  Ht Readings from Last 3 Encounters:  08/23/18 5' 4.17" (1.63 m)  08/03/17 5\' 4"  (1.626 m)  07/26/16 5' 4.5" (1.638 m)    General appearance: alert, cooperative and appears stated age Head: Normocephalic, without obvious abnormality, atraumatic Neck: no adenopathy, supple, symmetrical, trachea midline and thyroid normal to inspection and palpation Lungs: clear to auscultation bilaterally Cardiovascular: regular rate and rhythm Breasts: normal appearance, no masses or tenderness Abdomen: soft, non-tender; non distended,  no masses,  no organomegaly Extremities: extremities normal, atraumatic, no cyanosis or edema Skin: Skin color, texture, turgor normal. No  rashes or lesions Lymph nodes: Cervical, supraclavicular, and axillary nodes normal. No abnormal inguinal nodes palpated Neurologic: Grossly normal   Pelvic: External genitalia:  no lesions              Urethra:  normal appearing urethra with no masses, tenderness or lesions              Bartholins and Skenes: normal                 Vagina: normal appearing vagina with normal color and discharge, no lesions              Cervix: no lesions               Bimanual Exam:  Uterus:  normal size, contour, position, consistency, mobility, non-tender              Adnexa: no mass, fullness, tenderness               Rectovaginal: Confirms               Anus:  normal sphincter tone, no lesions  Chaperone was present for exam.  A:  Well Woman with normal exam  On HRT, willing to try to go off. She will call if having problems  Mild vaginal dryness, will try a lubricant, we discussed the option of vaginal estrogen  H/O vit d def  P:   Discussed breast self exam  Discussed calcium and vit D intake  Pap with hpv  Lipids checked at work  CBC, CMP, vit d  Mammogram and colonoscopy are utd

## 2018-08-23 ENCOUNTER — Other Ambulatory Visit: Payer: Self-pay

## 2018-08-23 ENCOUNTER — Ambulatory Visit (INDEPENDENT_AMBULATORY_CARE_PROVIDER_SITE_OTHER): Payer: 59 | Admitting: Obstetrics and Gynecology

## 2018-08-23 ENCOUNTER — Encounter: Payer: Self-pay | Admitting: Obstetrics and Gynecology

## 2018-08-23 VITALS — BP 108/70 | HR 76 | Ht 64.17 in | Wt 131.0 lb

## 2018-08-23 DIAGNOSIS — E559 Vitamin D deficiency, unspecified: Secondary | ICD-10-CM | POA: Diagnosis not present

## 2018-08-23 DIAGNOSIS — Z124 Encounter for screening for malignant neoplasm of cervix: Secondary | ICD-10-CM | POA: Diagnosis not present

## 2018-08-23 DIAGNOSIS — Z0000 Encounter for general adult medical examination without abnormal findings: Secondary | ICD-10-CM

## 2018-08-23 DIAGNOSIS — Z01419 Encounter for gynecological examination (general) (routine) without abnormal findings: Secondary | ICD-10-CM

## 2018-08-23 NOTE — Patient Instructions (Signed)

## 2018-08-24 LAB — COMPREHENSIVE METABOLIC PANEL
A/G RATIO: 1.5 (ref 1.2–2.2)
ALT: 15 IU/L (ref 0–32)
AST: 18 IU/L (ref 0–40)
Albumin: 4 g/dL (ref 3.6–4.8)
Alkaline Phosphatase: 75 IU/L (ref 39–117)
BILIRUBIN TOTAL: 0.4 mg/dL (ref 0.0–1.2)
BUN/Creatinine Ratio: 22 (ref 12–28)
BUN: 19 mg/dL (ref 8–27)
CHLORIDE: 104 mmol/L (ref 96–106)
CO2: 24 mmol/L (ref 20–29)
Calcium: 9.4 mg/dL (ref 8.7–10.3)
Creatinine, Ser: 0.88 mg/dL (ref 0.57–1.00)
GFR calc non Af Amer: 70 mL/min/{1.73_m2} (ref >59)
GFR, EST AFRICAN AMERICAN: 81 mL/min/{1.73_m2} (ref >59)
Globulin, Total: 2.6 g/dL (ref 1.5–4.5)
Glucose: 83 mg/dL (ref 65–99)
POTASSIUM: 4.2 mmol/L (ref 3.5–5.2)
SODIUM: 141 mmol/L (ref 134–144)
TOTAL PROTEIN: 6.6 g/dL (ref 6.0–8.5)

## 2018-08-24 LAB — CBC
HEMOGLOBIN: 12.5 g/dL (ref 11.1–15.9)
Hematocrit: 37.5 % (ref 34.0–46.6)
MCH: 29.3 pg (ref 26.6–33.0)
MCHC: 33.3 g/dL (ref 31.5–35.7)
MCV: 88 fL (ref 79–97)
Platelets: 374 10*3/uL (ref 150–450)
RBC: 4.26 x10E6/uL (ref 3.77–5.28)
RDW: 12.7 % (ref 12.3–15.4)
WBC: 6.3 10*3/uL (ref 3.4–10.8)

## 2018-08-24 LAB — CYTOLOGY - PAP
DIAGNOSIS: NEGATIVE
HPV: NOT DETECTED

## 2018-08-24 LAB — VITAMIN D 25 HYDROXY (VIT D DEFICIENCY, FRACTURES): Vit D, 25-Hydroxy: 34.5 ng/mL (ref 30.0–100.0)

## 2018-09-26 ENCOUNTER — Other Ambulatory Visit: Payer: Self-pay | Admitting: Obstetrics and Gynecology

## 2018-09-26 DIAGNOSIS — N951 Menopausal and female climacteric states: Secondary | ICD-10-CM

## 2018-09-26 NOTE — Telephone Encounter (Signed)
Can you check with the patient if she really needs refills of HRT, she was trying to go off.

## 2018-09-26 NOTE — Telephone Encounter (Signed)
Medication refill request: progesterone 100 mg  Last AEX:  08/23/18  Next AEX: 08/30/19 Last MMG (if hormonal medication request): 07/21/18   Bi-rads 1 Neg Refill authorized: #90 with 0RF

## 2018-10-16 NOTE — Telephone Encounter (Signed)
Spoke with patient. Patient states that she has decided to go back on HRT. Was having a hard time off of the medication with vasomotor symptoms. States she is almost out of one of the medications and cannot remember which one. Will check and return call.

## 2018-10-20 ENCOUNTER — Other Ambulatory Visit: Payer: Self-pay | Admitting: *Deleted

## 2018-10-20 DIAGNOSIS — N951 Menopausal and female climacteric states: Secondary | ICD-10-CM

## 2018-10-20 NOTE — Telephone Encounter (Signed)
Patient calling for refill on her estrogen. [facility] [address] in system.

## 2018-10-20 NOTE — Telephone Encounter (Signed)
Spoke with patient. Patient states she has enough of progesterone, but needs a RF on estradiol. RN advised would update pharmacy and send a request for estradiol to Dr. Talbert Nan. Patient agreeable.

## 2018-10-20 NOTE — Telephone Encounter (Signed)
Medication refill request: estradiol  Last AEX:  08-23-18 JJ  Next AEX: 08-30-19  Last MMG (if hormonal medication request): 07-21-18 density D/BIRADS 1 negative  Refill authorized: 09-21-17 #90, 2RF. Today, please advise.   See refill encounter dated 09-26-18. Patient needs refill on estradiol. States she has enough of progesterone. Pharmacy on file confirmed. Advised patient would route request to Dr. Talbert Nan. Patient agreeable.

## 2018-10-22 MED ORDER — ESTRADIOL 0.5 MG PO TABS: 0.5000 mg | ORAL_TABLET | Freq: Every day | ORAL | 3 refills | Status: DC

## 2018-10-22 MED ORDER — PROGESTERONE MICRONIZED 100 MG PO CAPS: ORAL_CAPSULE | ORAL | 3 refills | Status: DC

## 2018-10-22 NOTE — Telephone Encounter (Signed)
Please let the patient know that I sent in scripts for both the estrogen and progesterone for one year (she didn't have any refills on the progesterone)

## 2018-10-23 NOTE — Telephone Encounter (Signed)
Left detailed message for patient notifying her of message as seen below from [place], okay per ROI. Advised to return call with any additional questions. Encounter closed.

## 2018-12-22 ENCOUNTER — Other Ambulatory Visit: Payer: Self-pay | Admitting: Internal Medicine

## 2018-12-22 DIAGNOSIS — Z8249 Family history of ischemic heart disease and other diseases of the circulatory system: Secondary | ICD-10-CM

## 2018-12-29 ENCOUNTER — Other Ambulatory Visit: Payer: 59

## 2019-01-03 ENCOUNTER — Other Ambulatory Visit: Payer: Self-pay

## 2019-01-03 ENCOUNTER — Ambulatory Visit
Admission: RE | Admit: 2019-01-03 | Discharge: 2019-01-03 | Disposition: A | Discharge status: Home / Self Care | Payer: No Typology Code available for payment source | Source: Ambulatory Visit | Attending: Internal Medicine | Admitting: Internal Medicine

## 2019-01-03 DIAGNOSIS — Z8249 Family history of ischemic heart disease and other diseases of the circulatory system: Secondary | ICD-10-CM

## 2019-06-21 ENCOUNTER — Other Ambulatory Visit: Payer: Self-pay | Admitting: Internal Medicine

## 2019-06-21 DIAGNOSIS — Z1231 Encounter for screening mammogram for malignant neoplasm of breast: Secondary | ICD-10-CM

## 2019-06-29 ENCOUNTER — Other Ambulatory Visit: Payer: Self-pay | Admitting: *Deleted

## 2019-06-29 DIAGNOSIS — Z20822 Contact with and (suspected) exposure to covid-19: Secondary | ICD-10-CM

## 2019-07-01 LAB — NOVEL CORONAVIRUS, NAA: SARS-CoV-2, NAA: NOT DETECTED

## 2019-08-08 ENCOUNTER — Ambulatory Visit
Admission: RE | Admit: 2019-08-08 | Discharge: 2019-08-08 | Disposition: A | Discharge status: Home / Self Care | Payer: 59 | Source: Ambulatory Visit | Attending: Internal Medicine | Admitting: Internal Medicine

## 2019-08-08 ENCOUNTER — Other Ambulatory Visit: Payer: Self-pay

## 2019-08-08 DIAGNOSIS — Z1231 Encounter for screening mammogram for malignant neoplasm of breast: Secondary | ICD-10-CM

## 2019-08-27 NOTE — Progress Notes (Signed)
63 y.o. G3P0011 Single White or Caucasian Not Hispanic or Latino female here for annual exam. She went off of HRT last year, restarted when covid, stopped 60 days ago. Still having hot flashes a couple of times a night. Not sleeping great, taking melatonin.    Frequent UTI's with intercourse, not having sex much because of it. No dyspareunia with lubricant.  No bowel or bladder c/o. No vaginal bleeding.     Patient's last menstrual period was 01/30/2013.          Sexually active: Yes.    The current method of family planning is post menopausal status.    Exercising: Yes.    walking, golf, boot camp, body pump Smoker:  no  Health Maintenance: Pap:08/23/2018 WNL NEG HPV, 06-30-15 WNL NEG HR HPV History of abnormal Pap:no MMG:08/08/2019 Birads 1 negative Colonoscopy:10/2015 WNL Dr Collene Mares BMD:08-24-05 WNL TDaP:06-14-14 Gardasil:N/A   reports that she has never smoked. She has never used smokeless tobacco. She reports current alcohol use of about 7.0 standard drinks of alcohol per week. She reports that she does not use drugs. 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 She golf's and ski's   Past Medical History:  Diagnosis Date  . Microscopic hematuria   . MVP (mitral valve prolapse)   . MVP (mitral valve prolapse)    no pretreatment needed - never had any problems  . Seasonal allergies     Past Surgical History:  Procedure Laterality Date  . DILATATION & CURRETTAGE/HYSTEROSCOPY WITH RESECTOCOPE N/A 06/11/2013   Procedure: [place];  Surgeon: Peri Maris, MD;  Location: [facility];  Service: Gynecology;  Laterality: N/A;  . left foot surgery     with screws  . NOSE SURGERY  09/2014    Sinus   . RHINOPLASTY  1998   and septoplasty  . WISDOM TOOTH EXTRACTION      Current Outpatient Medications  Medication Sig Dispense Refill  . cholecalciferol (VITAMIN D) 1000 UNITS tablet Take 1,000 Units by mouth daily.     . Multiple Vitamin (MULTIVITAMIN) tablet Take 1 tablet by mouth daily.    . vitamin C (ASCORBIC ACID) 500 MG tablet Take 500 mg by mouth daily.     No current facility-administered medications for this visit.     Family History  Problem Relation Age of Onset  . Cancer Father   . Liver disease Sister        will need transplant  Dad with non hodgkins lymphoma.   Review of Systems  Constitutional:       Hot flashes Night sweats  HENT: Negative.   Eyes: Negative.   Respiratory: Negative.   Cardiovascular: Negative.   Gastrointestinal: Negative.   Endocrine: Negative.   Genitourinary: Negative.   Musculoskeletal: Negative.   Skin: Negative.   Allergic/Immunologic: Negative.   Neurological: Negative.   Hematological: Negative.   Psychiatric/Behavioral: Negative.     Exam:   BP 112/68 (BP Location: Right Arm, Patient Position: Sitting, Cuff Size: Normal)   Pulse 84   Temp (!) 97 F (36.1 C) (Skin)   Ht 5' 4.17" (1.63 m)   Wt 131 lb 6.4 oz (59.6 kg)   LMP 01/30/2013   BMI 22.43 kg/m   Weight change: @WEIGHTCHANGE @ Height:   Height: 5' 4.17" (163 cm)  Ht Readings from Last 3 Encounters:  08/30/19 5' 4.17" (1.63 m)  08/23/18 5' 4.17" (1.63 m)  08/03/17 5\' 4"  (1.626 m)    General appearance: alert,  cooperative and appears stated age Head: Normocephalic, without obvious abnormality, atraumatic Neck: no adenopathy, supple, symmetrical, trachea midline and thyroid normal to inspection and palpation Lungs: clear to auscultation bilaterally Cardiovascular: regular rate and rhythm Breasts: normal appearance, no masses or tenderness Abdomen: soft, non-tender; non distended,  no masses,  no organomegaly Extremities: extremities normal, atraumatic, no cyanosis or edema Skin: Skin color, texture, turgor normal. No rashes or lesions Lymph nodes: Cervical, supraclavicular, and axillary nodes normal. No abnormal inguinal nodes palpated Neurologic: Grossly normal   Pelvic:  External genitalia:  no lesions              Urethra:  normal appearing urethra with no masses, tenderness or lesions              Bartholins and Skenes: normal                 Vagina: normal appearing vagina with normal color and discharge, no lesions              Cervix: no lesions               Bimanual Exam:  Uterus:  normal size, contour, position, consistency, mobility, non-tender              Adnexa: no mass, fullness, tenderness               Rectovaginal: Confirms               Anus:  normal sphincter tone, no lesions  Chaperone was present for exam.  A:  Well Woman with normal exam  UTI's with intercourse  Mild vaginal atrophy  P:   Labs are UTD  Mammogram UTD  Colonoscopy UTD  DEXA next year  Discussed breast self exam  Discussed calcium and vit D intake

## 2019-08-29 ENCOUNTER — Other Ambulatory Visit: Payer: Self-pay

## 2019-08-30 ENCOUNTER — Ambulatory Visit (INDEPENDENT_AMBULATORY_CARE_PROVIDER_SITE_OTHER): Payer: 59 | Admitting: Obstetrics and Gynecology

## 2019-08-30 ENCOUNTER — Encounter: Payer: Self-pay | Admitting: Obstetrics and Gynecology

## 2019-08-30 VITALS — BP 112/68 | HR 84 | Temp 97.0°F | Ht 64.17 in | Wt 131.4 lb

## 2019-08-30 DIAGNOSIS — Z01419 Encounter for gynecological examination (general) (routine) without abnormal findings: Secondary | ICD-10-CM

## 2019-08-30 DIAGNOSIS — N390 Urinary tract infection, site not specified: Secondary | ICD-10-CM

## 2019-08-30 DIAGNOSIS — R232 Flushing: Secondary | ICD-10-CM | POA: Diagnosis not present

## 2019-08-30 MED ORDER — NITROFURANTOIN MACROCRYSTAL 50 MG PO CAPS: ORAL_CAPSULE | ORAL | 2 refills | Status: AC

## 2019-08-30 NOTE — Patient Instructions (Addendum)
Try estroven pm  EXERCISE AND DIET:  We recommended that you start or continue a regular exercise program for good health. Regular exercise means any activity that makes your heart beat faster and makes you sweat.  We recommend exercising at least 30 minutes per day at least 3 days a week, preferably 4 or 5.  We also recommend a diet low in fat and sugar.  Inactivity, poor dietary choices and obesity can cause diabetes, heart attack, stroke, and kidney damage, among others.    ALCOHOL AND SMOKING:  Women should limit their alcohol intake to no more than 7 drinks/beers/glasses of wine (combined, not each!) per week. Moderation of alcohol intake to this level decreases your risk of breast cancer and liver damage. And of course, no recreational drugs are part of a healthy lifestyle.  And absolutely no smoking or even second hand smoke. Most people know smoking can cause heart and lung diseases, but did you know it also contributes to weakening of your bones? Aging of your skin?  Yellowing of your teeth and nails?  CALCIUM AND VITAMIN D:  Adequate intake of calcium and Vitamin D are recommended.  The recommendations for exact amounts of these supplements seem to change often, but generally speaking 1,200 mg of calcium (between diet and supplement) and 800 units of Vitamin D per day seems prudent. Certain women may benefit from higher intake of Vitamin D.  If you are among these women, your doctor will have told you during your visit.    PAP SMEARS:  Pap smears, to check for cervical cancer or precancers,  have traditionally been done yearly, although recent scientific advances have shown that most women can have pap smears less often.  However, every woman still should have a physical exam from her gynecologist every year. It will include a breast check, inspection of the vulva and vagina to check for abnormal growths or skin changes, a visual exam of the cervix, and then an exam to evaluate the size and shape  of the uterus and ovaries.  And after 64 years of age, a rectal exam is indicated to check for rectal cancers. We will also provide age appropriate advice regarding health maintenance, like when you should have certain vaccines, screening for sexually transmitted diseases, bone density testing, colonoscopy, mammograms, etc.   MAMMOGRAMS:  All women over 22 years old should have a yearly mammogram. Many facilities now offer a "3D" mammogram, which may cost around $50 extra out of pocket. If possible,  we recommend you accept the option to have the 3D mammogram performed.  It both reduces the number of women who will be called back for extra views which then turn out to be normal, and it is better than the routine mammogram at detecting truly abnormal areas.    COLON CANCER SCREENING: Now recommend starting at age 69. At this time colonoscopy is not covered for routine screening until 50. There are take home tests that can be done between 45-49.   COLONOSCOPY:  Colonoscopy to screen for colon cancer is recommended for all women at age 50.  We know, you hate the idea of the prep.  We agree, BUT, having colon cancer and not knowing it is worse!!  Colon cancer so often starts as a polyp that can be seen and removed at colonscopy, which can quite literally save your life!  And if your first colonoscopy is normal and you have no family history of colon cancer, most women don't have to  have it again for 10 years.  Once every ten years, you can do something that may end up saving your life, right?  We will be happy to help you get it scheduled when you are ready.  Be sure to check your insurance coverage so you understand how much it will cost.  It may be covered as a preventative service at no cost, but you should check your particular policy.      Breast Self-Awareness Breast self-awareness means being familiar with how your breasts look and feel. It involves checking your breasts regularly and reporting any  changes to your health care provider. Practicing breast self-awareness is important. A change in your breasts can be a sign of a serious medical problem. Being familiar with how your breasts look and feel allows you to find any problems early, when treatment is more likely to be successful. All women should practice breast self-awareness, including women who have had breast implants. How to do a breast self-exam One way to learn what is normal for your breasts and whether your breasts are changing is to do a breast self-exam. To do a breast self-exam: Look for Changes  1. Remove all the clothing above your waist. 2. Stand in front of a mirror in a room with good lighting. 3. Put your hands on your hips. 4. Push your hands firmly downward. 5. Compare your breasts in the mirror. Look for differences between them (asymmetry), such as: ? Differences in shape. ? Differences in size. ? Puckers, dips, and bumps in one breast and not the other. 6. Look at each breast for changes in your skin, such as: ? Redness. ? Scaly areas. 7. Look for changes in your nipples, such as: ? Discharge. ? Bleeding. ? Dimpling. ? Redness. ? A change in position. Feel for Changes Carefully feel your breasts for lumps and changes. It is best to do this while lying on your back on the floor and again while sitting or standing in the shower or tub with soapy water on your skin. Feel each breast in the following way:  Place the arm on the side of the breast you are examining above your head.  Feel your breast with the other hand.  Start in the nipple area and make  inch (2 cm) overlapping circles to feel your breast. Use the pads of your three middle fingers to do this. Apply light pressure, then medium pressure, then firm pressure. The light pressure will allow you to feel the tissue closest to the skin. The medium pressure will allow you to feel the tissue that is a little deeper. The firm pressure will allow you to  feel the tissue close to the ribs.  Continue the overlapping circles, moving downward over the breast until you feel your ribs below your breast.  Move one finger-width toward the center of the body. Continue to use the  inch (2 cm) overlapping circles to feel your breast as you move slowly up toward your collarbone.  Continue the up and down exam using all three pressures until you reach your armpit.  Write Down What You Find  Write down what is normal for each breast and any changes that you find. Keep a written record with breast changes or normal findings for each breast. By writing this information down, you do not need to depend only on memory for size, tenderness, or location. Write down where you are in your menstrual cycle, if you are still menstruating. If you are  having trouble noticing differences in your breasts, do not get discouraged. With time you will become more familiar with the variations in your breasts and more comfortable with the exam. How often should I examine my breasts? Examine your breasts every month. If you are breastfeeding, the best time to examine your breasts is after a feeding or after using a breast pump. If you menstruate, the best time to examine your breasts is 5-7 days after your period is over. During your period, your breasts are lumpier, and it may be more difficult to notice changes. When should I see my health care provider? See your health care provider if you notice:  A change in shape or size of your breasts or nipples.  A change in the skin of your breast or nipples, such as a reddened or scaly area.  Unusual discharge from your nipples.  A lump or thick area that was not there before.  Pain in your breasts.  Anything that concerns you.

## 2019-10-30 IMAGING — MG DIGITAL SCREENING BILAT W/ TOMO W/ CAD
8 series · 9 of 24 positions shown · non-contrast
Comparison: None.

CLINICAL DATA: Screening.

EXAM:
DIGITAL SCREENING BILATERAL MAMMOGRAM WITH TOMO AND CAD

[L MLO synth-2D]
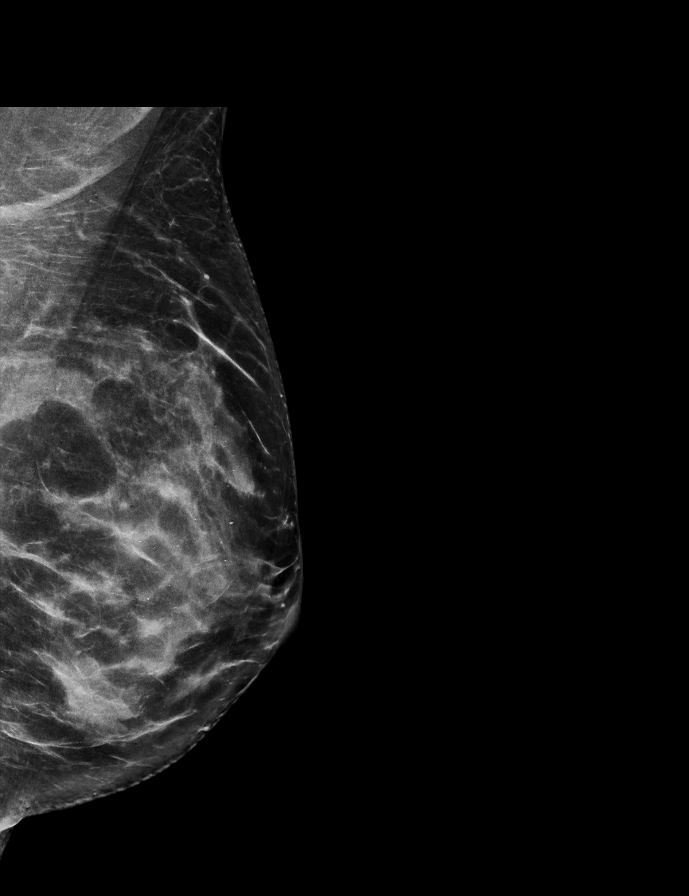

[R CC synth-2D]
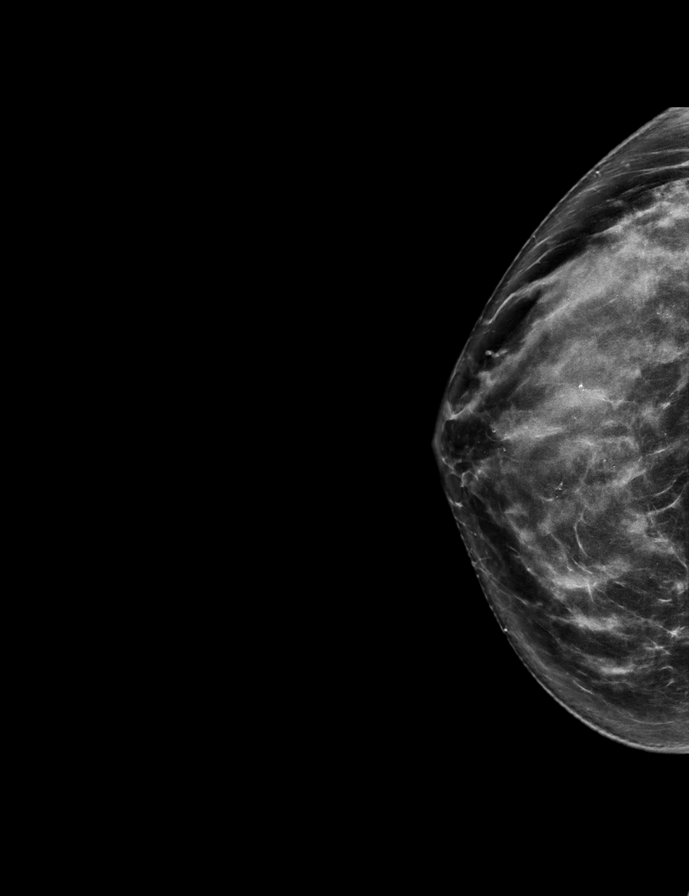

[R MLO synth-2D]
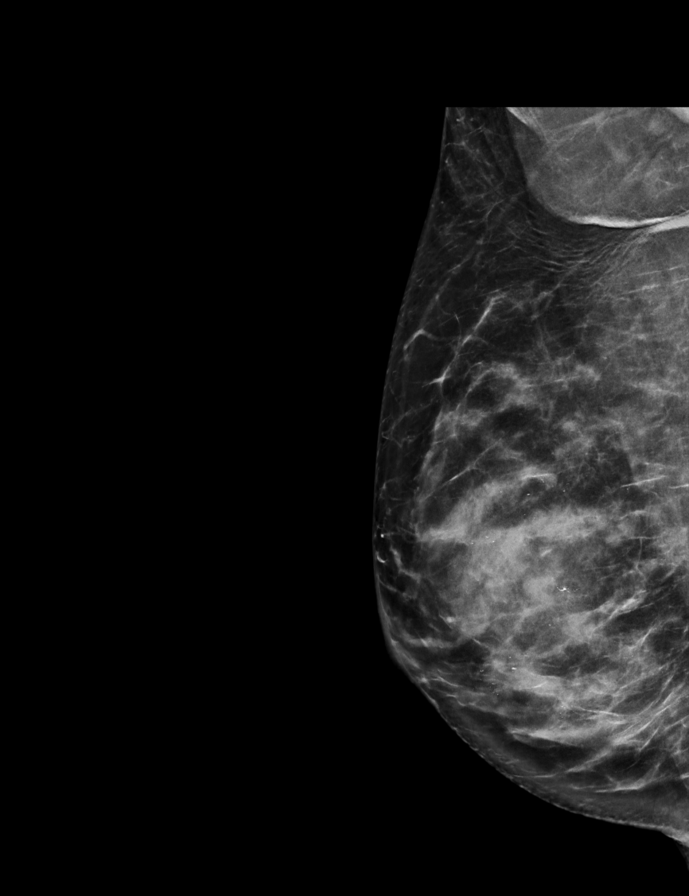

[L CC synth-2D]
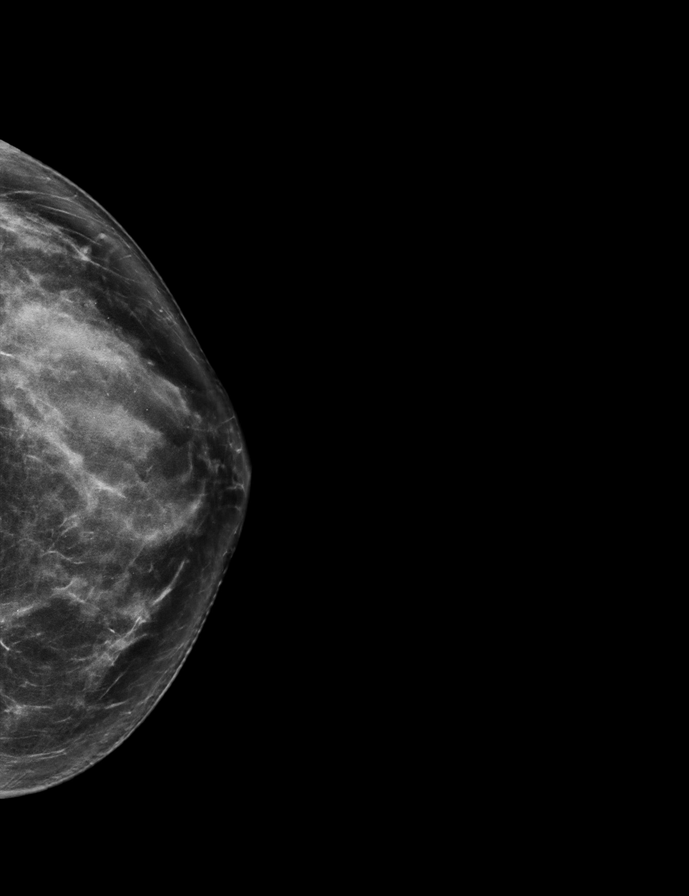

[L MLO tomo · 2 of 71 frames shown]
[frame 23/71]
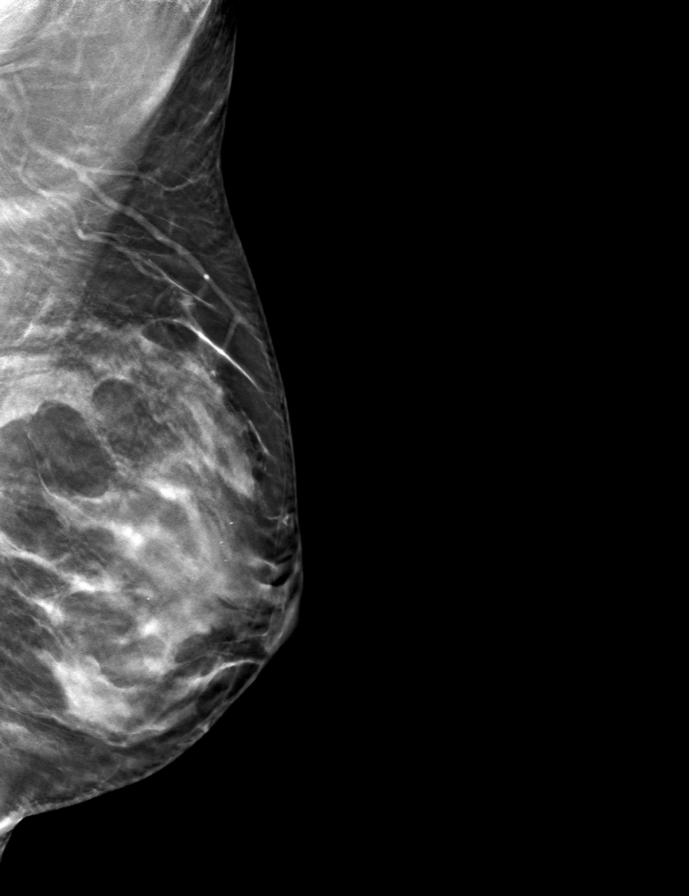
[frame 36/71]
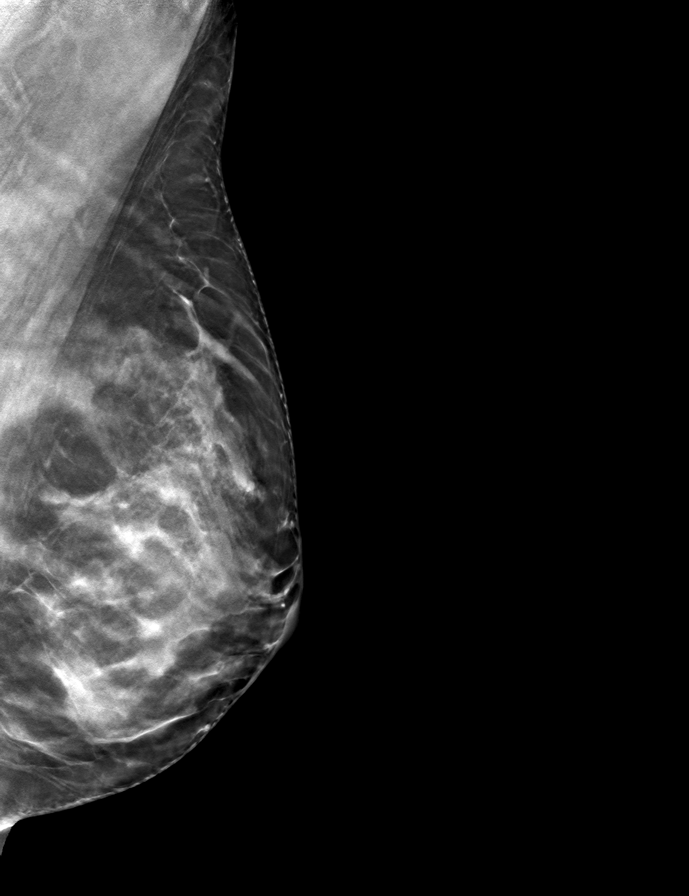

[R MLO tomo · tomo slice 37/72.0]
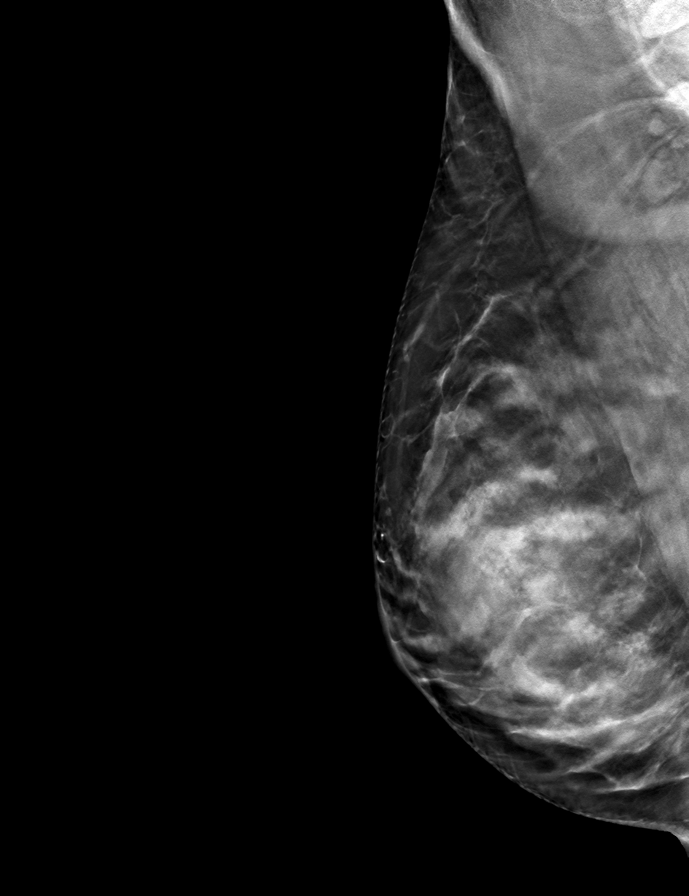

[R CC tomo · tomo slice 35/68.0]
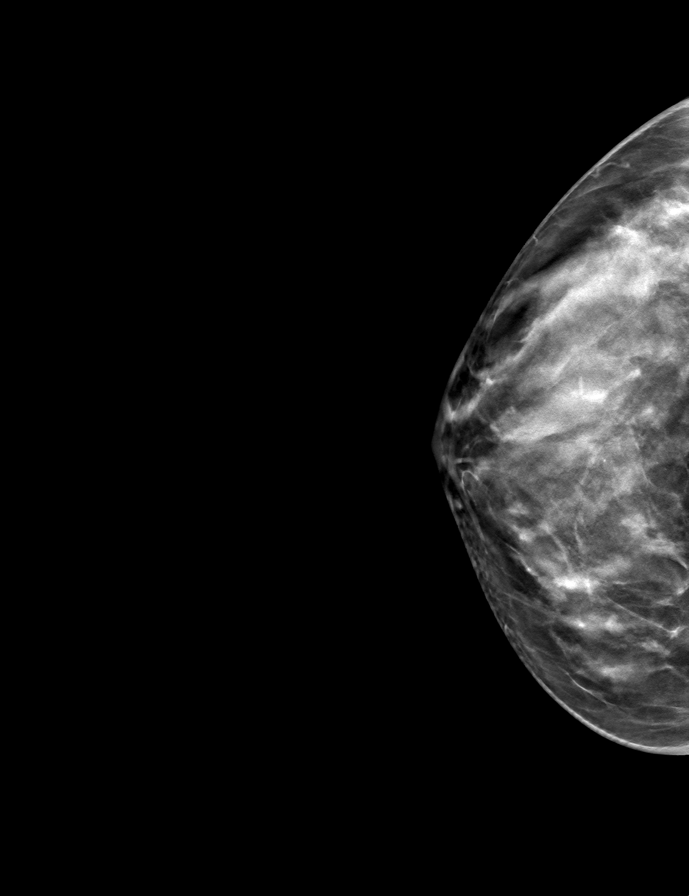

[L CC tomo · tomo slice 36/71.0]
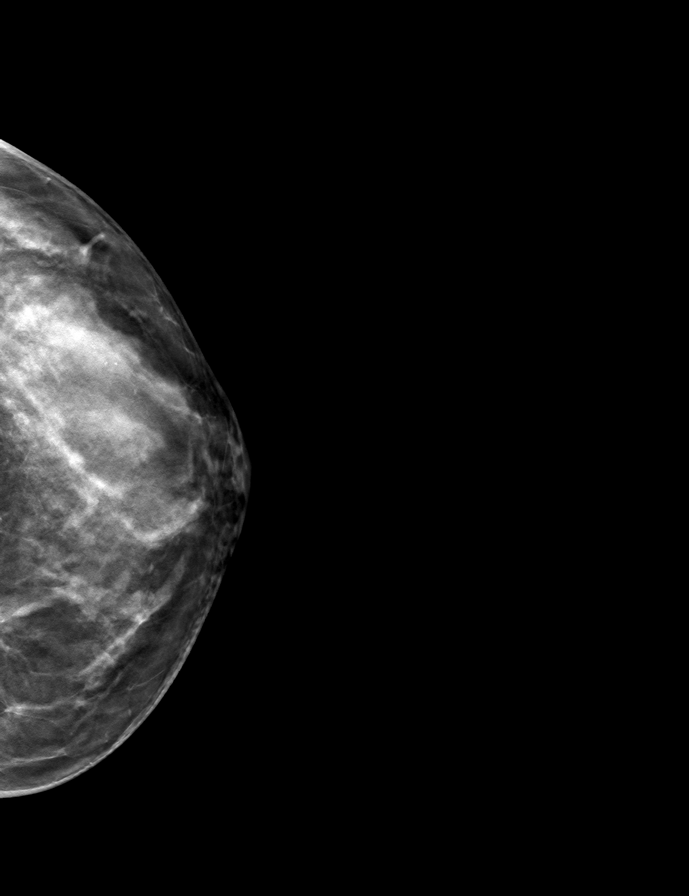

[9 of 24 positions shown; findings below may reference images not displayed]

ACR Breast Density Category b: There are scattered areas of
fibroglandular density.
FINDINGS: There are no findings suspicious for malignancy. Images were
processed with CAD.
IMPRESSION: No mammographic evidence of malignancy. A result letter of this
screening mammogram will be mailed directly to the patient.

RECOMMENDATION:
Screening mammogram in one year. (Code:Y5-G-EJ6)

BI-RADS CATEGORY  1: Negative.

## 2020-07-08 ENCOUNTER — Other Ambulatory Visit: Payer: Self-pay | Admitting: Internal Medicine

## 2020-07-08 DIAGNOSIS — Z1231 Encounter for screening mammogram for malignant neoplasm of breast: Secondary | ICD-10-CM

## 2020-08-12 ENCOUNTER — Ambulatory Visit: Payer: 59

## 2020-09-02 ENCOUNTER — Ambulatory Visit: Payer: 59

## 2020-09-15 NOTE — Progress Notes (Signed)
64 y.o. G1P0011 Single White or Caucasian Not Hispanic or Latino female here for annual exam.  She is still having hot flashes. She has been off HRT x 2 years. Vasomotor symptoms are improving, but still there. Up 1-2 x night with sweats, down from 2-3 x a night. She has tried [employer], she takes melatonin.  No vaginal bleeding.   H/O UTI's with intercourse. She is so hot just not wanting to be sexually active right now.     Patient's last menstrual period was 01/30/2013.          Sexually active: No.  The current method of family planning is post menopausal status.    Exercising: Yes.    golf, Skiing, strenght training, walking  Smoker:  no  Health Maintenance: Pap:  08/23/2018 WNL NEG HPV, 06-30-15 WNL NEG HR HPV History of abnormal Pap:  no MMG:  08/08/19 density B Bi-rads 1 Neg, delayed to January secondary to her covid booster shot.  BMD:   08-24-05 WNL Colonoscopy: 10/2015 WNL Dr Collene Mares TDaP:  06/14/14  Gardasil: NA   reports that she has never smoked. She has never used smokeless tobacco. She reports current alcohol use of about 7.0 standard drinks of alcohol per week. She reports that she does not use drugs. 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 Lives with her partner. She golf's and ski's. Son is an Nurse, learning disability pro in [place].   Past Medical History:  Diagnosis Date  . Microscopic hematuria   . MVP (mitral valve prolapse)   . MVP (mitral valve prolapse)    no pretreatment needed - never had any problems  . Seasonal allergies     Past Surgical History:  Procedure Laterality Date  . DILATATION & CURRETTAGE/HYSTEROSCOPY WITH RESECTOCOPE N/A 06/11/2013   Procedure: Beltrami;  Surgeon: Peri Maris, MD;  Location: [facility];  Service: Gynecology;  Laterality: N/A;  . left foot surgery     with screws  . NOSE SURGERY  09/2014    Sinus   . RHINOPLASTY  1998   and septoplasty  . WISDOM  TOOTH EXTRACTION      Current Outpatient Medications  Medication Sig Dispense Refill  . cholecalciferol (VITAMIN D) 1000 UNITS tablet Take 1,000 Units by mouth daily.    . Multiple Vitamin (MULTIVITAMIN) tablet Take 1 tablet by mouth daily.    . nitrofurantoin (MACRODANTIN) 50 MG capsule Take one table po prn intercourse 30 capsule 2  . vitamin C (ASCORBIC ACID) 500 MG tablet Take 500 mg by mouth daily.     No current facility-administered medications for this visit.    Family History  Problem Relation Age of Onset  . Cancer Father   . Liver disease Sister        will need transplant    Review of Systems  All other systems reviewed and are negative.   Exam:   BP 110/68   Pulse 71   Ht 5\' 4"  (1.626 m)   Wt 126 lb (57.2 kg)   LMP 01/30/2013   SpO2 99%   BMI 21.63 kg/m   Weight change: @WEIGHTCHANGE @ Height:   Height: 5\' 4"  (162.6 cm)  Ht Readings from Last 3 Encounters:  09/17/20 5\' 4"  (1.626 m)  08/30/19 5' 4.17" (1.63 m)  08/23/18 5' 4.17" (1.63 m)    General appearance: alert, cooperative and appears stated age Head: Normocephalic, without obvious abnormality, atraumatic Neck: no adenopathy, supple, symmetrical,  trachea midline and thyroid normal to inspection and palpation Lungs: clear to auscultation bilaterally Cardiovascular: regular rate and rhythm Breasts: normal appearance, no masses or tenderness Abdomen: soft, non-tender; non distended,  no masses,  no organomegaly Extremities: extremities normal, atraumatic, no cyanosis or edema Skin: Skin color, texture, turgor normal. No rashes or lesions Lymph nodes: Cervical, supraclavicular, and axillary nodes normal. No abnormal inguinal nodes palpated Neurologic: Grossly normal   Pelvic: External genitalia:  no lesions              Urethra:  normal appearing urethra with no masses, tenderness or lesions              Bartholins and Skenes: normal                 Vagina: atrophic appearing vagina with normal  color and discharge, no lesions              Cervix: no lesions               Bimanual Exam:  Uterus:  normal size, contour, position, consistency, mobility, non-tender              Adnexa: no mass, fullness, tenderness               Rectovaginal: Confirms               Anus:  normal sphincter tone, no lesions  Terence Lux chaperoned for the exam.  A:  Well Woman with normal exam  Vasomotor symptoms, overall tolerable. Declines treatment.   P:   No pap this year  Mammogram in 1/22  Colonoscopy UTD  DEXA ordered  Discussed breast self exam  Discussed calcium and vit D intake  Screening labs

## 2020-09-17 ENCOUNTER — Other Ambulatory Visit: Payer: Self-pay

## 2020-09-17 ENCOUNTER — Ambulatory Visit (INDEPENDENT_AMBULATORY_CARE_PROVIDER_SITE_OTHER): Payer: 59 | Admitting: Obstetrics and Gynecology

## 2020-09-17 ENCOUNTER — Encounter: Payer: Self-pay | Admitting: Obstetrics and Gynecology

## 2020-09-17 VITALS — BP 110/68 | HR 71 | Ht 64.0 in | Wt 126.0 lb

## 2020-09-17 DIAGNOSIS — Z0000 Encounter for general adult medical examination without abnormal findings: Secondary | ICD-10-CM | POA: Diagnosis not present

## 2020-09-17 DIAGNOSIS — Z01419 Encounter for gynecological examination (general) (routine) without abnormal findings: Secondary | ICD-10-CM

## 2020-09-17 DIAGNOSIS — Z780 Asymptomatic menopausal state: Secondary | ICD-10-CM | POA: Diagnosis not present

## 2020-09-17 DIAGNOSIS — E2839 Other primary ovarian failure: Secondary | ICD-10-CM

## 2020-09-17 NOTE — Patient Instructions (Addendum)
EXERCISE   We recommended that you start or continue a regular exercise program for good health. Physical activity is anything that gets your body moving, some is better than none. The CDC recommends 150 minutes per week of Moderate-Intensity Aerobic Activity and 2 or more days of Muscle Strengthening Activity.  Benefits of exercise are limitless: helps weight loss/weight maintenance, improves mood and energy, helps with depression and anxiety, improves sleep, tones and strengthens muscles, improves balance, improves bone density, protects from chronic conditions such as heart disease, high blood pressure and diabetes and so much more. To learn more visit: WhyNotPoker.uy  DIET: Good nutrition starts with a healthy diet of fruits, vegetables, whole grains, and lean protein sources. Drink plenty of water for hydration. Minimize empty calories, sodium, sweets. For more information about dietary recommendations visit: GeekRegister.com.ee and http://schaefer-mitchell.com/  ALCOHOL:  Women should limit their alcohol intake to no more than 7 drinks/beers/glasses of wine (combined, not each!) per week. Moderation of alcohol intake to this level decreases your risk of breast cancer and liver damage.  If you are concerned that you may have a problem, or your friends have told you they are concerned about your drinking, there are many resources to help. A well-known program that is free, effective, and available to all people all over the nation is Alcoholics Anonymous.  Check out this site to learn more: BlockTaxes.se  CALCIUM AND VITAMIN D:  Adequate intake of calcium and Vitamin D are recommended for bone health.  The recommendations for exact amounts of these supplements seem to change often, but generally speaking 1000-1500 mg of calcium (between diet and supplement) and 800 units of Vitamin D per day seems prudent.     PAP  SMEARS:  Pap smears, to check for cervical cancer or precancers,  have traditionally been done yearly, although recent scientific advances have shown that most women can have pap smears less often.  However, every woman still should have a physical exam from her gynecologist every year. It will include a breast check, inspection of the vulva and vagina to check for abnormal growths or skin changes, a visual exam of the cervix, and then an exam to evaluate the size and shape of the uterus and ovaries.  And after 65 years of age, a rectal exam is indicated to check for rectal cancers. We will also provide age appropriate advice regarding health maintenance, like when you should have certain vaccines, screening for sexually transmitted diseases, bone density testing, colonoscopy, mammograms, etc.   MAMMOGRAMS:  All women over 52 years old should have a routine mammogram.   COLON CANCER SCREENING: Now recommend starting at age 40. At this time colonoscopy is not covered for routine screening until 50. There are take home tests that can be done between 45-49.   COLONOSCOPY:  Colonoscopy to screen for colon cancer is recommended for all women at age 70.  We know, you hate the idea of the prep.  We agree, BUT, having colon cancer and not knowing it is worse!!  Colon cancer so often starts as a polyp that can be seen and removed at colonscopy, which can quite literally save your life!  And if your first colonoscopy is normal and you have no family history of colon cancer, most women don't have to have it again for 10 years.  Once every ten years, you can do something that may end up saving your life, right?  We will be happy to help you get it scheduled when you  are ready.  Be sure to check your insurance coverage so you understand how much it will cost.  It may be covered as a preventative service at no cost, but you should check your particular policy.      Breast Self-Awareness Breast self-awareness means  being familiar with how your breasts look and feel. It involves checking your breasts regularly and reporting any changes to your health care provider. Practicing breast self-awareness is important. A change in your breasts can be a sign of a serious medical problem. Being familiar with how your breasts look and feel allows you to find any problems early, when treatment is more likely to be successful. All women should practice breast self-awareness, including women who have had breast implants. How to do a breast self-exam One way to learn what is normal for your breasts and whether your breasts are changing is to do a breast self-exam. To do a breast self-exam: Look for Changes  1. Remove all the clothing above your waist. 2. Stand in front of a mirror in a room with good lighting. 3. Put your hands on your hips. 4. Push your hands firmly downward. 5. Compare your breasts in the mirror. Look for differences between them (asymmetry), such as: ? Differences in shape. ? Differences in size. ? Puckers, dips, and bumps in one breast and not the other. 6. Look at each breast for changes in your skin, such as: ? Redness. ? Scaly areas. 7. Look for changes in your nipples, such as: ? Discharge. ? Bleeding. ? Dimpling. ? Redness. ? A change in position. Feel for Changes Carefully feel your breasts for lumps and changes. It is best to do this while lying on your back on the floor and again while sitting or standing in the shower or tub with soapy water on your skin. Feel each breast in the following way:  Place the arm on the side of the breast you are examining above your head.  Feel your breast with the other hand.  Start in the nipple area and make  inch (2 cm) overlapping circles to feel your breast. Use the pads of your three middle fingers to do this. Apply light pressure, then medium pressure, then firm pressure. The light pressure will allow you to feel the tissue closest to the skin.  The medium pressure will allow you to feel the tissue that is a little deeper. The firm pressure will allow you to feel the tissue close to the ribs.  Continue the overlapping circles, moving downward over the breast until you feel your ribs below your breast.  Move one finger-width toward the center of the body. Continue to use the  inch (2 cm) overlapping circles to feel your breast as you move slowly up toward your collarbone.  Continue the up and down exam using all three pressures until you reach your armpit.  Write Down What You Find  Write down what is normal for each breast and any changes that you find. Keep a written record with breast changes or normal findings for each breast. By writing this information down, you do not need to depend only on memory for size, tenderness, or location. Write down where you are in your menstrual cycle, if you are still menstruating. If you are having trouble noticing differences in your breasts, do not get discouraged. With time you will become more familiar with the variations in your breasts and more comfortable with the exam. How often should I examine my  breasts? Examine your breasts every month. If you are breastfeeding, the best time to examine your breasts is after a feeding or after using a breast pump. If you menstruate, the best time to examine your breasts is 5-7 days after your period is over. During your period, your breasts are lumpier, and it may be more difficult to notice changes. When should I see my health care provider? See your health care provider if you notice:  A change in shape or size of your breasts or nipples.  A change in the skin of your breast or nipples, such as a reddened or scaly area.  Unusual discharge from your nipples.  A lump or thick area that was not there before.  Pain in your breasts.  Anything that concerns you.   Menopause Menopause is the normal time of life when menstrual periods stop  completely. It is usually confirmed by 12 months without a menstrual period. The transition to menopause (perimenopause) most often happens between the ages of 54 and 19. During perimenopause, hormone levels change in your body, which can cause symptoms and affect your health. Menopause may increase your risk for:  Loss of bone (osteoporosis), which causes bone breaks (fractures).  Depression.  Hardening and narrowing of the arteries (atherosclerosis), which can cause heart attacks and strokes. What are the causes? This condition is usually caused by a natural change in hormone levels that happens as you get older. The condition may also be caused by surgery to remove both ovaries (bilateral oophorectomy). What increases the risk? This condition is more likely to start at an earlier age if you have certain medical conditions or treatments, including:  A tumor of the pituitary gland in the brain.  A disease that affects the ovaries and hormone production.  Radiation treatment for cancer.  Certain cancer treatments, such as chemotherapy or hormone (anti-estrogen) therapy.  Heavy smoking and excessive alcohol use.  Family history of early menopause. This condition is also more likely to develop earlier in women who are very thin. What are the signs or symptoms? Symptoms of this condition include:  Hot flashes.  Irregular menstrual periods.  Night sweats.  Changes in feelings about sex. This could be a decrease in sex drive or an increased comfort around your sexuality.  Vaginal dryness and thinning of the vaginal walls. This may cause painful intercourse.  Dryness of the skin and development of wrinkles.  Headaches.  Problems sleeping (insomnia).  Mood swings or irritability.  Memory problems.  Weight gain.  Hair growth on the face and chest.  Bladder infections or problems with urinating. How is this diagnosed? This condition is diagnosed based on your medical  history, a physical exam, your age, your menstrual history, and your symptoms. Hormone tests may also be done. How is this treated? In some cases, no treatment is needed. You and your health care provider should make a decision together about whether treatment is necessary. Treatment will be based on your individual condition and preferences. Treatment for this condition focuses on managing symptoms. Treatment may include:  Menopausal hormone therapy (MHT).  Medicines to treat specific symptoms or complications.  Acupuncture.  Vitamin or herbal supplements. Before starting treatment, make sure to let your health care provider know if you have a personal or family history of:  Heart disease.  Breast cancer.  Blood clots.  Diabetes.  Osteoporosis. Follow these instructions at home: Lifestyle  Do not use any products that contain nicotine or tobacco, such as cigarettes and  e-cigarettes. If you need help quitting, ask your health care provider.  Get at least 30 minutes of physical activity on 5 or more days each week.  Avoid alcoholic and caffeinated beverages, as well as spicy foods. This may help prevent hot flashes.  Get 7-8 hours of sleep each night.  If you have hot flashes, try: ? Dressing in layers. ? Avoiding things that may trigger hot flashes, such as spicy food, warm places, or stress. ? Taking slow, deep breaths when a hot flash starts. ? Keeping a fan in your home and office.  Find ways to manage stress, such as deep breathing, meditation, or journaling.  Consider going to group therapy with other women who are having menopause symptoms. Ask your health care provider about recommended group therapy meetings. Eating and drinking  Eat a healthy, balanced diet that contains whole grains, lean protein, low-fat dairy, and plenty of fruits and vegetables.  Your health care provider may recommend adding more soy to your diet. Foods that contain soy include tofu,  tempeh, and soy milk.  Eat plenty of foods that contain calcium and vitamin D for bone health. Items that are rich in calcium include low-fat milk, yogurt, beans, almonds, sardines, broccoli, and kale. Medicines  Take over-the-counter and prescription medicines only as told by your health care provider.  Talk with your health care provider before starting any herbal supplements. If prescribed, take vitamins and supplements as told by your health care provider. These may include: ? Calcium. Women age 19 and older should get 1,200 mg (milligrams) of calcium every day. ? Vitamin D. Women need 600-800 International Units of vitamin D each day. ? Vitamins B12 and B6. Aim for 50 micrograms of B12 and 1.5 mg of B6 each day. General instructions  Keep track of your menstrual periods, including: ? When they occur. ? How heavy they are and how long they last. ? How much time passes between periods.  Keep track of your symptoms, noting when they start, how often you have them, and how long they last.  Use vaginal lubricants or moisturizers to help with vaginal dryness and improve comfort during sex.  Keep all follow-up visits as told by your health care provider. This is important. This includes any group therapy or counseling. Contact a health care provider if:  You are still having menstrual periods after age 38.  You have pain during sex.  You have not had a period for 12 months and you develop vaginal bleeding. Get help right away if:  You have: ? Severe depression. ? Excessive vaginal bleeding. ? Pain when you urinate. ? A fast or irregular heart beat (palpitations). ? Severe headaches. ? Abdomen (abdominal) pain or severe indigestion.  You fell and you think you have a broken bone.  You develop leg or chest pain.  You develop vision problems.  You feel a lump in your breast. Summary  Menopause is the normal time of life when menstrual periods stop completely. It is usually  confirmed by 12 months without a menstrual period.  The transition to menopause (perimenopause) most often happens between the ages of 39 and 14.  Symptoms can be managed through medicines, lifestyle changes, and complementary therapies such as acupuncture.  Eat a balanced diet that is rich in nutrients to promote bone health and heart health and to manage symptoms during menopause. This information is not intended to replace advice given to you by your health care provider. Make sure you discuss any questions you  have with your health care provider. Document Revised: 09/16/2017 Document Reviewed: 11/06/2016 Elsevier Patient Education  2020 [employer].

## 2020-09-18 LAB — LIPID PANEL
Chol/HDL Ratio: 2.9 ratio (ref 0.0–4.4)
Cholesterol, Total: 272 mg/dL — ABNORMAL HIGH (ref 100–199)
HDL: 95 mg/dL (ref >39)
LDL Chol Calc (NIH): 159 mg/dL — ABNORMAL HIGH (ref 0–99)
Triglycerides: 109 mg/dL (ref 0–149)
VLDL Cholesterol Cal: 18 mg/dL (ref 5–40)

## 2020-09-18 LAB — COMPREHENSIVE METABOLIC PANEL
ALT: 19 IU/L (ref 0–32)
AST: 18 IU/L (ref 0–40)
Albumin/Globulin Ratio: 1.7 (ref 1.2–2.2)
Albumin: 4.2 g/dL (ref 3.8–4.8)
Alkaline Phosphatase: 110 IU/L (ref 44–121)
BUN/Creatinine Ratio: 21 (ref 12–28)
BUN: 22 mg/dL (ref 8–27)
Bilirubin Total: 0.4 mg/dL (ref 0.0–1.2)
CO2: 26 mmol/L (ref 20–29)
Calcium: 9.3 mg/dL (ref 8.7–10.3)
Chloride: 104 mmol/L (ref 96–106)
Creatinine, Ser: 1.04 mg/dL — ABNORMAL HIGH (ref 0.57–1.00)
GFR calc Af Amer: 65 mL/min/{1.73_m2} (ref >59)
GFR calc non Af Amer: 57 mL/min/{1.73_m2} — ABNORMAL LOW (ref >59)
Globulin, Total: 2.5 g/dL (ref 1.5–4.5)
Glucose: 69 mg/dL (ref 65–99)
Potassium: 4 mmol/L (ref 3.5–5.2)
Sodium: 144 mmol/L (ref 134–144)
Total Protein: 6.7 g/dL (ref 6.0–8.5)

## 2020-09-18 LAB — CBC
Hematocrit: 37.2 % (ref 34.0–46.6)
Hemoglobin: 13.1 g/dL (ref 11.1–15.9)
MCH: 31.2 pg (ref 26.6–33.0)
MCHC: 35.2 g/dL (ref 31.5–35.7)
MCV: 89 fL (ref 79–97)
Platelets: 309 10*3/uL (ref 150–450)
RBC: 4.2 x10E6/uL (ref 3.77–5.28)
RDW: 12.7 % (ref 11.7–15.4)
WBC: 6.1 10*3/uL (ref 3.4–10.8)

## 2020-09-20 ENCOUNTER — Other Ambulatory Visit: Payer: Self-pay | Admitting: Obstetrics and Gynecology

## 2020-09-20 DIAGNOSIS — R7989 Other specified abnormal findings of blood chemistry: Secondary | ICD-10-CM

## 2020-10-08 ENCOUNTER — Other Ambulatory Visit: Payer: Self-pay | Admitting: Obstetrics and Gynecology

## 2020-10-08 DIAGNOSIS — E2839 Other primary ovarian failure: Secondary | ICD-10-CM

## 2020-10-08 DIAGNOSIS — Z780 Asymptomatic menopausal state: Secondary | ICD-10-CM

## 2020-10-09 ENCOUNTER — Ambulatory Visit
Admission: RE | Admit: 2020-10-09 | Discharge: 2020-10-09 | Disposition: A | Discharge status: Home / Self Care | Payer: 59 | Source: Ambulatory Visit | Attending: Internal Medicine | Admitting: Internal Medicine

## 2020-10-09 ENCOUNTER — Other Ambulatory Visit: Payer: Self-pay

## 2020-10-09 DIAGNOSIS — Z1231 Encounter for screening mammogram for malignant neoplasm of breast: Secondary | ICD-10-CM

## 2020-10-21 ENCOUNTER — Ambulatory Visit: Payer: 59

## 2021-01-14 ENCOUNTER — Other Ambulatory Visit: Payer: Self-pay

## 2021-01-14 ENCOUNTER — Ambulatory Visit
Admission: RE | Admit: 2021-01-14 | Discharge: 2021-01-14 | Disposition: A | Discharge status: Home / Self Care | Payer: 59 | Source: Ambulatory Visit | Attending: Obstetrics and Gynecology | Admitting: Obstetrics and Gynecology

## 2021-01-14 DIAGNOSIS — Z780 Asymptomatic menopausal state: Secondary | ICD-10-CM

## 2021-01-14 DIAGNOSIS — E2839 Other primary ovarian failure: Secondary | ICD-10-CM

## 2021-07-28 ENCOUNTER — Ambulatory Visit (INDEPENDENT_AMBULATORY_CARE_PROVIDER_SITE_OTHER): Payer: 59 | Admitting: Sports Medicine

## 2021-07-28 ENCOUNTER — Encounter: Payer: Self-pay | Admitting: Sports Medicine

## 2021-07-28 DIAGNOSIS — M2170 Unequal limb length (acquired), unspecified site: Secondary | ICD-10-CM

## 2021-07-28 DIAGNOSIS — M2011 Hallux valgus (acquired), right foot: Secondary | ICD-10-CM

## 2021-07-28 DIAGNOSIS — M21611 Bunion of right foot: Secondary | ICD-10-CM

## 2021-07-28 NOTE — Assessment & Plan Note (Signed)
Patient was fitted for a : standard, cushioned, semi-rigid orthotic. The orthotic was heated and afterward the patient stood on the orthotic blank positioned on the orthotic stand. The patient was positioned in subtalar neutral position and 10 degrees of ankle dorsiflexion in a weight bearing stance. After completion of molding, a stable base was applied to the orthotic blank. The blank was ground to a stable position for weight bearing. Size: 6 orange EVA med. density Base: Blue medium EVA Posting:1 CM mod. Stiffness EVA lift for right Additional orthotic padding:  After completion of the orthotics she had a neutral gait and had good comfort.  They felt supportive of her bunions bilaterally so we did not an extra first ray posting at this time.

## 2021-07-28 NOTE — Progress Notes (Signed)
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  At the time she had bilateral bunions.  She had a leg length inequality.  And she had pronation with loss of her longitudinal arch. These have actually continued to hold up pretty well and allowed her to do activities without much foot pain. The exception to this being that she did have to have surgery to correct the bunion on the left foot.  Physical exam Pleasant thin female in no acute distress Ht 5' 4.5" (1.638 m)   Wt 127 lb (57.6 kg)   LMP 01/30/2013   BMI 21.46 kg/m  No flowsheet data found.  Foot evaluations Left foot no longer has a bunion and the alignment is good There is a bunionette Motion of the great toe is somewhat limited Some loss of the longitudinal arch  Right foot Significant bunion Bunionette Some pronation and loss of the longitudinal arch Right leg measures about 1-1 and half centimeter shorter

## 2021-07-29 ENCOUNTER — Ambulatory Visit
Admission: RE | Admit: 2021-07-29 | Discharge: 2021-07-29 | Disposition: A | Discharge status: Home / Self Care | Payer: 59 | Source: Ambulatory Visit | Attending: Internal Medicine | Admitting: Internal Medicine

## 2021-07-29 ENCOUNTER — Other Ambulatory Visit: Payer: Self-pay | Admitting: Internal Medicine

## 2021-07-29 DIAGNOSIS — R059 Cough, unspecified: Secondary | ICD-10-CM

## 2021-09-22 NOTE — Progress Notes (Signed)
65 y.o. G1P0011 Single White or Caucasian Not Hispanic or Latino female here for annual exam.  No vaginal bleeding, no dyspareunia.    Sister died in 10/08/2023, liver and kidney failure. She was only 63. She has one other sister.  H/o UTI's with intercourse, takes macrodantin prn intercourse (sometimes).   No bowel or bladder c/o.   Patient's last menstrual period was 01/30/2013.          Sexually active: Yes.    The current method of family planning is post menopausal status.    Exercising: Yes.     Walking weights golf skiing biking  Smoker:  no  Health Maintenance: Pap:  08/23/2018 WNL NEG HPV, 06-30-15 WNL NEG HR HPV History of abnormal Pap:  no MMG:  10/09/20 density C Bi-rads 1 neg, scheduled in 1/23  BMD:  01/14/21 Osteopenic, T score -1.2  Colonoscopy: 2017 with Dr. Collene Mares Normal, f/u in 7-10 years.  TDaP:  06/14/14  Gardasil: n/a   reports that she has never smoked. She has never used smokeless tobacco. She reports current alcohol use of about 7.0 standard drinks per week. She reports that she does not use drugs. 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 Lives with her long term partner Heath Lark (together x 12 years). She golf's and ski's. Son is an Nurse, learning disability pro currently working in [place] for 6 months.     Past Medical History:  Diagnosis Date   Microscopic hematuria    MVP (mitral valve prolapse)    MVP (mitral valve prolapse)    no pretreatment needed - never had any problems   Seasonal allergies     Past Surgical History:  Procedure Laterality Date   DILATATION & CURRETTAGE/HYSTEROSCOPY WITH RESECTOCOPE N/A 06/11/2013   Procedure: [place];  Surgeon: Peri Maris, MD;  Location: [facility];  Service: Gynecology;  Laterality: N/A;   left foot surgery     with screws   NOSE SURGERY  09/2014    Sinus    RHINOPLASTY   1998   and septoplasty   WISDOM TOOTH EXTRACTION      Current Outpatient Medications  Medication Sig Dispense Refill   cholecalciferol (VITAMIN D) 1000 UNITS tablet Take 1,000 Units by mouth daily.     Multiple Vitamin (MULTIVITAMIN) tablet Take 1 tablet by mouth daily.     nitrofurantoin (MACRODANTIN) 50 MG capsule Take one table po prn intercourse 30 capsule 2   vitamin C (ASCORBIC ACID) 500 MG tablet Take 500 mg by mouth daily.     No current facility-administered medications for this visit.    Family History  Problem Relation Age of Onset   Cancer Father    Liver disease Sister        will need transplant    Review of Systems  All other systems reviewed and are negative.  Exam:   BP 128/72   Pulse 70   Ht 5' 4.5" (1.638 m)   Wt 132 lb (59.9 kg)   LMP 01/30/2013   SpO2 100%   BMI 22.31 kg/m   Weight change: @WEIGHTCHANGE @ Height:   Height: 5' 4.5" (163.8 cm)  Ht Readings from Last 3 Encounters:  09/30/21 5' 4.5" (1.638 m)  07/28/21 5' 4.5" (1.638 m)  09/17/20 5\' 4"  (1.626 m)    General appearance: alert, cooperative and appears stated age Head: Normocephalic, without obvious  abnormality, atraumatic Neck: no adenopathy, supple, symmetrical, trachea midline and thyroid normal to inspection and palpation Lungs: clear to auscultation bilaterally Cardiovascular: regular rate and rhythm Breasts: normal appearance, no masses or tenderness Abdomen: soft, non-tender; non distended,  no masses,  no organomegaly Extremities: extremities normal, atraumatic, no cyanosis or edema Skin: Skin color, texture, turgor normal. No rashes or lesions Lymph nodes: Cervical, supraclavicular, and axillary nodes normal. No abnormal inguinal nodes palpated Neurologic: Grossly normal   Pelvic: External genitalia:  no lesions              Urethra:  normal appearing urethra with no masses, tenderness or lesions              Bartholins and Skenes: normal                 Vagina: atrophic  appearing vagina with normal color and discharge, no lesions              Cervix: no lesions               Bimanual Exam:  Uterus:  normal size, contour, position, consistency, mobility, non-tender              Adnexa: no mass, fullness, tenderness               Rectovaginal: Confirms               Anus:  normal sphincter tone, no lesions  Gae Dry chaperoned for the exam.  1. Well woman exam No pap this year Mammogram scheduled Colonoscopy UTD Discussed breast self exam Discussed calcium and vit D intake Labs with primary

## 2021-09-23 ENCOUNTER — Other Ambulatory Visit: Payer: Self-pay | Admitting: Internal Medicine

## 2021-09-23 DIAGNOSIS — Z1231 Encounter for screening mammogram for malignant neoplasm of breast: Secondary | ICD-10-CM

## 2021-09-30 ENCOUNTER — Other Ambulatory Visit: Payer: Self-pay

## 2021-09-30 ENCOUNTER — Encounter: Payer: Self-pay | Admitting: Obstetrics and Gynecology

## 2021-09-30 ENCOUNTER — Ambulatory Visit (INDEPENDENT_AMBULATORY_CARE_PROVIDER_SITE_OTHER): Payer: 59 | Admitting: Obstetrics and Gynecology

## 2021-09-30 VITALS — BP 128/72 | HR 70 | Ht 64.5 in | Wt 132.0 lb

## 2021-09-30 DIAGNOSIS — Z01419 Encounter for gynecological examination (general) (routine) without abnormal findings: Secondary | ICD-10-CM

## 2021-09-30 NOTE — Patient Instructions (Signed)

## 2021-10-23 ENCOUNTER — Ambulatory Visit
Admission: RE | Admit: 2021-10-23 | Discharge: 2021-10-23 | Disposition: A | Discharge status: Home / Self Care | Payer: 59 | Source: Ambulatory Visit | Attending: Internal Medicine | Admitting: Internal Medicine

## 2021-10-23 DIAGNOSIS — Z1231 Encounter for screening mammogram for malignant neoplasm of breast: Secondary | ICD-10-CM

## 2022-09-27 ENCOUNTER — Other Ambulatory Visit: Payer: Self-pay | Admitting: Obstetrics and Gynecology

## 2022-09-27 DIAGNOSIS — Z1231 Encounter for screening mammogram for malignant neoplasm of breast: Secondary | ICD-10-CM

## 2022-09-27 NOTE — Progress Notes (Signed)
66 y.o. G1P0011 Single White or Caucasian Not Hispanic or Latino female here for annual exam.   No vaginal bleeding. Not sexually active. No bowel or bladder c/o.   Patient would like to have a urine check for hematuria. She had blood in her urine at a recent check.     H/o UTI's with intercourse, takes macrodantin prn intercourse   Patient's last menstrual period was 01/30/2013.          Sexually active: No.  The current method of family planning is post menopausal.    Exercising: Yes.     Cardio  Smoker:  no  Health Maintenance: Pap:  08/23/2018 WNL NEG HPV, 06-30-15 WNL NEG HR HPV  History of abnormal Pap:  no MMG:  10/23/21 density C Bi-rads 1 neg  BMD:   01/14/22 osteopenic  Colonoscopy: 10/2015 WNL Dr Collene Mares, scheduled in 2/24.  TDaP:  06/14/14  Gardasil: NA   reports that she has never smoked. She has never used smokeless tobacco. She reports current alcohol use of about 7.0 standard drinks of alcohol per week. She reports that she does not use drugs. Averages 5-6 drinks a week. 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 Lives with her long term partner Heath Lark (together x 3years). She golf's and ski's. Son is a golf pro currently in [facility].   Past Medical History:  Diagnosis Date   Microscopic hematuria    MVP (mitral valve prolapse)    MVP (mitral valve prolapse)    no pretreatment needed - never had any problems   Seasonal allergies     Past Surgical History:  Procedure Laterality Date   DILATATION & CURRETTAGE/HYSTEROSCOPY WITH RESECTOCOPE N/A 06/11/2013   Procedure: [place];  Surgeon: Peri Maris, MD;  Location: [facility];  Service: Gynecology;  Laterality: N/A;   left foot surgery     with screws   NOSE SURGERY  09/2014    Sinus    RHINOPLASTY  1998   and septoplasty   WISDOM TOOTH EXTRACTION      Current Outpatient  Medications  Medication Sig Dispense Refill   cholecalciferol (VITAMIN D) 1000 UNITS tablet Take 1,000 Units by mouth daily.     Multiple Vitamin (MULTIVITAMIN) tablet Take 1 tablet by mouth daily.     nitrofurantoin (MACRODANTIN) 50 MG capsule Take one table po prn intercourse 30 capsule 2   vitamin C (ASCORBIC ACID) 500 MG tablet Take 500 mg by mouth daily.     No current facility-administered medications for this visit.    Family History  Problem Relation Age of Onset   Cancer Father    Liver disease Sister        will need transplant    Review of Systems  All other systems reviewed and are negative.   Exam:   BP 130/62   Pulse 77   Ht '5\' 4"'$  (1.626 m)   Wt 137 lb (62.1 kg)   LMP 01/30/2013   SpO2 100%   BMI 23.52 kg/m   Weight change: '@WEIGHTCHANGE'$ @ Height:   Height: '5\' 4"'$  (162.6 cm)  Ht Readings from Last 3 Encounters:  10/07/22 '5\' 4"'$  (1.626 m)  09/30/21 5' 4.5" (1.638 m)  07/28/21 5' 4.5" (1.638 m)    General appearance: alert, cooperative and appears stated age Head: Normocephalic, without obvious abnormality, atraumatic Neck: no adenopathy, supple, symmetrical, trachea midline and  thyroid normal to inspection and palpation Lungs: clear to auscultation bilaterally Cardiovascular: regular rate and rhythm Breasts: normal appearance, no masses or tenderness Abdomen: soft, non-tender; non distended,  no masses,  no organomegaly Extremities: extremities normal, atraumatic, no cyanosis or edema Skin: Skin color, texture, turgor normal. No rashes or lesions Lymph nodes: Cervical, supraclavicular, and axillary nodes normal. No abnormal inguinal nodes palpated Neurologic: Grossly normal   Pelvic: External genitalia:  no lesions              Urethra:  normal appearing urethra with no masses, tenderness or lesions              Bartholins and Skenes: normal                 Vagina: atrophic appearing vagina with normal color and discharge, no lesions               Cervix: no lesions               Bimanual Exam:  Uterus:  normal size, contour, position, consistency, mobility, non-tender              Adnexa: no mass, fullness, tenderness               Rectovaginal: Confirms               Anus:  normal sphincter tone, no lesions  Gae Dry, CMA chaperoned for the exam.  1. Well woman exam Mammogram scheduled Colonoscopy scheduled Pap next year Labs with primary Discussed breast self exam Discussed calcium and vit D intake   2. History of hematuria - Urinalysis, Complete

## 2022-10-07 ENCOUNTER — Ambulatory Visit (INDEPENDENT_AMBULATORY_CARE_PROVIDER_SITE_OTHER): Payer: 59 | Admitting: Obstetrics and Gynecology

## 2022-10-07 ENCOUNTER — Encounter: Payer: Self-pay | Admitting: Obstetrics and Gynecology

## 2022-10-07 VITALS — BP 130/62 | HR 77 | Ht 64.0 in | Wt 137.0 lb

## 2022-10-07 DIAGNOSIS — Z01419 Encounter for gynecological examination (general) (routine) without abnormal findings: Secondary | ICD-10-CM

## 2022-10-07 DIAGNOSIS — R3129 Other microscopic hematuria: Secondary | ICD-10-CM

## 2022-10-07 DIAGNOSIS — K5900 Constipation, unspecified: Secondary | ICD-10-CM | POA: Insufficient documentation

## 2022-10-07 DIAGNOSIS — Z1211 Encounter for screening for malignant neoplasm of colon: Secondary | ICD-10-CM | POA: Insufficient documentation

## 2022-10-07 DIAGNOSIS — Z87448 Personal history of other diseases of urinary system: Secondary | ICD-10-CM

## 2022-10-07 LAB — URINALYSIS, COMPLETE
Bacteria, UA: NONE SEEN /HPF
Bilirubin Urine: NEGATIVE
Casts: NONE SEEN /LPF
Crystals: NONE SEEN /HPF
Glucose, UA: NEGATIVE
Hyaline Cast: NONE SEEN /LPF
Ketones, ur: NEGATIVE
Nitrite: NEGATIVE
Protein, ur: NEGATIVE
Specific Gravity, Urine: 1.02 (ref 1.001–1.035)
Yeast: NONE SEEN /HPF
pH: 6 (ref 5.0–8.0)

## 2022-10-07 NOTE — Patient Instructions (Signed)

## 2022-10-07 NOTE — Addendum Note (Signed)
Addended by: Dorothy Spark on: 10/07/2022 09:06 AM   Modules accepted: Orders

## 2022-10-08 LAB — URINE CULTURE
MICRO NUMBER:: 14345604
Result:: NO GROWTH
SPECIMEN QUALITY:: ADEQUATE

## 2022-10-13 ENCOUNTER — Telehealth: Payer: Self-pay

## 2022-10-13 DIAGNOSIS — Z87448 Personal history of other diseases of urinary system: Secondary | ICD-10-CM

## 2022-10-13 DIAGNOSIS — R3129 Other microscopic hematuria: Secondary | ICD-10-CM

## 2022-10-13 NOTE — Telephone Encounter (Signed)
Referral to Urology for reoccurring hematuria per JJ.   Pt notified of ucx results and inquired if pt has been evaluated by urologist in past. Pt reports has seen Dr. Risa Grill in past. However, >15 years ago. Pt advised that if unable to locate him or if retired will [facility] to [facility]. Pt voiced understanding.

## 2022-10-15 NOTE — Telephone Encounter (Signed)
FYI. Pt is scheduled with Dr. Claudia Desanctis on 11/05/2022.

## 2022-11-25 ENCOUNTER — Ambulatory Visit
Admission: RE | Admit: 2022-11-25 | Discharge: 2022-11-25 | Disposition: A | Discharge status: Home / Self Care | Payer: 59 | Source: Ambulatory Visit | Attending: Obstetrics and Gynecology | Admitting: Obstetrics and Gynecology

## 2022-11-25 DIAGNOSIS — Z1231 Encounter for screening mammogram for malignant neoplasm of breast: Secondary | ICD-10-CM

## 2023-10-13 ENCOUNTER — Other Ambulatory Visit: Payer: Self-pay | Admitting: Family Medicine

## 2023-10-13 DIAGNOSIS — Z1231 Encounter for screening mammogram for malignant neoplasm of breast: Secondary | ICD-10-CM

## 2023-11-29 ENCOUNTER — Ambulatory Visit
Admission: RE | Admit: 2023-11-29 | Discharge: 2023-11-29 | Disposition: A | Discharge status: Home / Self Care | Payer: 59 | Source: Ambulatory Visit | Attending: Family Medicine | Admitting: Family Medicine

## 2023-11-29 ENCOUNTER — Ambulatory Visit: Payer: 59

## 2023-11-29 DIAGNOSIS — Z1231 Encounter for screening mammogram for malignant neoplasm of breast: Secondary | ICD-10-CM

## 2023-11-30 NOTE — Progress Notes (Signed)
 68 y.o. G52P0011 Single Caucasian female here for annual exam.    Some weight gain.  Her work makes her less active now.   Hx post coital UTIs. Declines Rx.   Taking calcium about 600 mg daily.  Used to bike a lot. Enjoys skiing.   Lawyer by education and profession.  Working with [employer].  Long term partner.  Adopted a son from [place], now 86 yo.   Sister passed away 2 years ago, age 58.   PCP: Eartha Inch, MD   Patient's last menstrual period was 01/30/2013.           Sexually active: Yes.    The current method of family planning is post menopausal status.    Menopausal hormone therapy:  n/a Exercising: Yes.     Snow skiing, weight lifting and cardio 3-4x a week, walking, golf, bike riding Smoker:  no  OB History  Gravida Para Term Preterm AB Living  1 0   1 1  SAB IAB Ectopic Multiple Live Births   1       # Outcome Date GA Lbr Len/2nd Weight Sex Type Anes PTL Lv  1 IAB 1976             HEALTH MAINTENANCE: Last 2 paps:  08/23/18 neg: HR HPV neg History of abnormal Pap or positive HPV:  no Mammogram:   11/29/23 Breast Density Cat C, BI-RADS CAT 1 neg Colonoscopy:  11/2022 - Dr. Loreta Ave Bone Density:  01/14/21  Result  osteopenia of hip.  FRAX 24.6%/1.3%  Immunization History  Administered Date(s) Administered   Influenza-Unspecified 07/27/2022   Moderna Covid-19 Fall Seasonal Vaccine 2yrs & older 07/20/2022, 06/25/2023   Moderna Sars-Covid-2 Vaccination 11/10/2019, 12/08/2019, 08/17/2020   Tdap 06/14/2014      reports that she has never smoked. She has never used smokeless tobacco. She reports current alcohol use of about 7.0 standard drinks of alcohol per week. She reports that she does not use drugs.  Past Medical History:  Diagnosis Date   Microscopic hematuria    MVP (mitral valve prolapse)    MVP (mitral valve prolapse)    no pretreatment needed - never had any problems   Seasonal allergies     Past Surgical History:  Procedure  Laterality Date   DILATATION & CURRETTAGE/HYSTEROSCOPY WITH RESECTOCOPE N/A 06/11/2013   Procedure: DILATATION & CURETTAGE/HYSTEROSCOPY WITH RESECTOCOPE;  Surgeon: Alison Murray, MD;  Location: [facility];  Service: Gynecology;  Laterality: N/A;   left foot surgery     with screws   NOSE SURGERY  09/2014    Sinus    RHINOPLASTY  1998   and septoplasty   WISDOM TOOTH EXTRACTION      Current Outpatient Medications  Medication Sig Dispense Refill   cholecalciferol (VITAMIN D) 1000 UNITS tablet Take 1,000 Units by mouth daily.     latanoprost (XALATAN) 0.005 % ophthalmic solution 1 drop at bedtime.     Multiple Vitamin (MULTIVITAMIN) tablet Take 1 tablet by mouth daily.     nitrofurantoin (MACRODANTIN) 50 MG capsule Take one table po prn intercourse 30 capsule 2   vitamin C (ASCORBIC ACID) 500 MG tablet Take 500 mg by mouth daily.     No current facility-administered medications for this visit.    ALLERGIES: Codeine  Family History  Problem Relation Age of Onset   Cancer Father    Liver disease Sister        will need transplant    Review of  Systems  All other systems reviewed and are negative.   PHYSICAL EXAM:  BP 128/84 (BP Location: Right Arm, Patient Position: Sitting, Cuff Size: Small)   Ht 5' 5.5" (1.664 m)   Wt 142 lb (64.4 kg)   LMP 01/30/2013   BMI 23.27 kg/m     General appearance: alert, cooperative and appears stated age Head: normocephalic, without obvious abnormality, atraumatic Neck: no adenopathy, supple, symmetrical, trachea midline and thyroid normal to inspection and palpation Lungs: clear to auscultation bilaterally Breasts: normal appearance, no masses or tenderness, No nipple retraction or dimpling, No nipple discharge or bleeding, No axillary adenopathy Heart: regular rate and rhythm Abdomen: soft, non-tender; no masses, no organomegaly Extremities: extremities normal, atraumatic, no cyanosis or edema Skin: skin color, texture, turgor normal. No  rashes or lesions Lymph nodes: cervical, supraclavicular, and axillary nodes normal. Neurologic: grossly normal  Pelvic: External genitalia:  right and left perineal area with small tiny skin ulcers.                No abnormal inguinal nodes palpated.              Urethra:  normal appearing urethra with no masses, tenderness or lesions              Bartholins and Skenes: normal                 Vagina: normal appearing vagina with normal color and discharge, no lesions              Cervix: no lesions              Pap taken: yes Bimanual Exam:  Uterus:  normal size, contour, position, consistency, mobility, non-tender              Adnexa: no mass, fullness, tenderness              Rectal exam: yes.  Confirms.              Anus:  normal sphincter tone, no lesions  Chaperone was present for exam:  Warren Lacy, CMA  ASSESSMENT: Well woman visit with gynecologic exam Cervical cancer screening.  Osteopenia with increased FRAX score.  Hx fractures due to sports. Perineal skin ulceration.  Patient indicates that these are recurrent and occur when she uses compressive sport clothing.  PHQ2:  0   PLAN: Mammogram screening discussed. Self breast awareness reviewed. Pap and HRV collected:  Yes Guidelines for Calcium, Vitamin D, regular exercise program including cardiovascular and weight bearing exercise. Medication refills:  [facility]  Patient will call to schedule.  Labs with PCP.  Follow up:  yearly and prn.

## 2023-12-08 ENCOUNTER — Ambulatory Visit: Payer: 59 | Admitting: Obstetrics and Gynecology

## 2023-12-14 ENCOUNTER — Encounter: Payer: Self-pay | Admitting: Obstetrics and Gynecology

## 2023-12-14 ENCOUNTER — Ambulatory Visit (INDEPENDENT_AMBULATORY_CARE_PROVIDER_SITE_OTHER): Payer: Managed Care, Other (non HMO) | Admitting: Obstetrics and Gynecology

## 2023-12-14 ENCOUNTER — Other Ambulatory Visit (HOSPITAL_COMMUNITY)
Admission: RE | Admit: 2023-12-14 | Discharge: 2023-12-14 | Disposition: A | Discharge status: Home / Self Care | Source: Ambulatory Visit | Attending: Obstetrics and Gynecology | Admitting: Obstetrics and Gynecology

## 2023-12-14 VITALS — BP 128/84 | Ht 65.5 in | Wt 142.0 lb

## 2023-12-14 DIAGNOSIS — M8580 Other specified disorders of bone density and structure, unspecified site: Secondary | ICD-10-CM

## 2023-12-14 DIAGNOSIS — Z1331 Encounter for screening for depression: Secondary | ICD-10-CM

## 2023-12-14 DIAGNOSIS — Z124 Encounter for screening for malignant neoplasm of cervix: Secondary | ICD-10-CM

## 2023-12-14 DIAGNOSIS — Z01419 Encounter for gynecological examination (general) (routine) without abnormal findings: Secondary | ICD-10-CM

## 2023-12-14 NOTE — Patient Instructions (Signed)

## 2023-12-21 ENCOUNTER — Encounter: Payer: Self-pay | Admitting: Obstetrics and Gynecology

## 2023-12-21 LAB — CYTOLOGY - PAP
Comment: NEGATIVE
Diagnosis: NEGATIVE
High risk HPV: NEGATIVE

## 2024-05-04 ENCOUNTER — Encounter: Payer: Self-pay | Admitting: Advanced Practice Midwife

## 2024-08-07 ENCOUNTER — Other Ambulatory Visit: Payer: 59

## 2024-08-07 ENCOUNTER — Ambulatory Visit (HOSPITAL_BASED_OUTPATIENT_CLINIC_OR_DEPARTMENT_OTHER)
Admission: RE | Admit: 2024-08-07 | Discharge: 2024-08-07 | Disposition: A | Discharge status: Home / Self Care | Source: Ambulatory Visit | Attending: Obstetrics and Gynecology | Admitting: Obstetrics and Gynecology

## 2024-08-07 DIAGNOSIS — M8580 Other specified disorders of bone density and structure, unspecified site: Secondary | ICD-10-CM | POA: Diagnosis present

## 2024-08-08 ENCOUNTER — Ambulatory Visit: Payer: Self-pay | Admitting: Obstetrics and Gynecology

## 2024-09-27 ENCOUNTER — Other Ambulatory Visit: Payer: 59

## 2025-01-15 ENCOUNTER — Ambulatory Visit: Admitting: Obstetrics and Gynecology
